# Patient Record
Sex: Female | Born: 1955
Health system: Southern US, Community
[De-identification: ages and names within clinical notes are randomized; demographics above are authoritative.]

## PROBLEM LIST (undated history)

## (undated) DIAGNOSIS — R112 Nausea with vomiting, unspecified: Secondary | ICD-10-CM

## (undated) DIAGNOSIS — E079 Disorder of thyroid, unspecified: Secondary | ICD-10-CM

## (undated) DIAGNOSIS — I10 Essential (primary) hypertension: Secondary | ICD-10-CM

## (undated) DIAGNOSIS — J069 Acute upper respiratory infection, unspecified: Secondary | ICD-10-CM

## (undated) DIAGNOSIS — L02214 Cutaneous abscess of groin: Secondary | ICD-10-CM

## (undated) DIAGNOSIS — L732 Hidradenitis suppurativa: Secondary | ICD-10-CM

## (undated) DIAGNOSIS — G8929 Other chronic pain: Secondary | ICD-10-CM

## (undated) DIAGNOSIS — D649 Anemia, unspecified: Secondary | ICD-10-CM

## (undated) DIAGNOSIS — T783XXA Angioneurotic edema, initial encounter: Secondary | ICD-10-CM

## (undated) DIAGNOSIS — R51 Headache: Secondary | ICD-10-CM

## (undated) DIAGNOSIS — M1990 Unspecified osteoarthritis, unspecified site: Secondary | ICD-10-CM

## (undated) DIAGNOSIS — E039 Hypothyroidism, unspecified: Secondary | ICD-10-CM

## (undated) DIAGNOSIS — R519 Headache, unspecified: Secondary | ICD-10-CM

## (undated) DIAGNOSIS — F419 Anxiety disorder, unspecified: Secondary | ICD-10-CM

## (undated) DIAGNOSIS — K219 Gastro-esophageal reflux disease without esophagitis: Secondary | ICD-10-CM

## (undated) DIAGNOSIS — T7840XA Allergy, unspecified, initial encounter: Secondary | ICD-10-CM

## (undated) DIAGNOSIS — Z9889 Other specified postprocedural states: Secondary | ICD-10-CM

## (undated) DIAGNOSIS — I499 Cardiac arrhythmia, unspecified: Secondary | ICD-10-CM

## (undated) DIAGNOSIS — G479 Sleep disorder, unspecified: Secondary | ICD-10-CM

## (undated) HISTORY — DX: Headache: R51

## (undated) HISTORY — PX: INCISE AND DRAIN ABCESS: PRO64

## (undated) HISTORY — DX: Other chronic pain: G89.29

## (undated) HISTORY — DX: Acute upper respiratory infection, unspecified: J06.9

## (undated) HISTORY — DX: Disorder of thyroid, unspecified: E07.9

## (undated) HISTORY — DX: Angioneurotic edema, initial encounter: T78.3XXA

## (undated) HISTORY — DX: Headache, unspecified: R51.9

## (undated) HISTORY — DX: Sleep disorder, unspecified: G47.9

## (undated) HISTORY — DX: Unspecified osteoarthritis, unspecified site: M19.90

## (undated) HISTORY — DX: Cutaneous abscess of groin: L02.214

## (undated) HISTORY — DX: Allergy, unspecified, initial encounter: T78.40XA

## (undated) HISTORY — DX: Hidradenitis suppurativa: L73.2

## (undated) HISTORY — DX: Essential (primary) hypertension: I10

## (undated) HISTORY — PX: BREAST SURGERY: SHX581

## (undated) HISTORY — PX: TUBAL LIGATION: SHX77

## (undated) HISTORY — PX: SINOSCOPY: SHX187

---

## 1991-07-20 HISTORY — PX: ABDOMINAL HYSTERECTOMY: SHX81

## 1999-04-06 ENCOUNTER — Ambulatory Visit (HOSPITAL_BASED_OUTPATIENT_CLINIC_OR_DEPARTMENT_OTHER): Admission: RE | Admit: 1999-04-06 | Discharge: 1999-04-06 | Payer: Self-pay | Admitting: Oral Surgery

## 2000-01-25 ENCOUNTER — Encounter: Payer: Self-pay | Admitting: Internal Medicine

## 2000-01-25 ENCOUNTER — Encounter: Admission: RE | Admit: 2000-01-25 | Discharge: 2000-01-25 | Payer: Self-pay | Admitting: Internal Medicine

## 2000-07-19 HISTORY — PX: TEMPOROMANDIBULAR JOINT SURGERY: SHX35

## 2001-08-24 ENCOUNTER — Emergency Department (HOSPITAL_COMMUNITY): Admission: EM | Admit: 2001-08-24 | Discharge: 2001-08-24 | Payer: Self-pay

## 2001-08-31 ENCOUNTER — Encounter: Admission: RE | Admit: 2001-08-31 | Discharge: 2001-08-31 | Payer: Self-pay | Admitting: Internal Medicine

## 2001-08-31 ENCOUNTER — Encounter: Payer: Self-pay | Admitting: Internal Medicine

## 2002-01-08 ENCOUNTER — Encounter: Payer: Self-pay | Admitting: Internal Medicine

## 2002-01-08 ENCOUNTER — Encounter: Admission: RE | Admit: 2002-01-08 | Discharge: 2002-01-08 | Payer: Self-pay | Admitting: Internal Medicine

## 2002-07-04 ENCOUNTER — Emergency Department (HOSPITAL_COMMUNITY): Admission: EM | Admit: 2002-07-04 | Discharge: 2002-07-04 | Payer: Self-pay | Admitting: Emergency Medicine

## 2004-07-21 ENCOUNTER — Encounter: Admission: RE | Admit: 2004-07-21 | Discharge: 2004-07-21 | Payer: Self-pay | Admitting: Internal Medicine

## 2006-08-23 ENCOUNTER — Encounter: Admission: RE | Admit: 2006-08-23 | Discharge: 2006-08-23 | Payer: Self-pay | Admitting: Internal Medicine

## 2006-11-11 ENCOUNTER — Encounter: Admission: RE | Admit: 2006-11-11 | Discharge: 2006-11-11 | Payer: Self-pay | Admitting: Internal Medicine

## 2006-11-18 ENCOUNTER — Encounter: Admission: RE | Admit: 2006-11-18 | Discharge: 2006-11-18 | Payer: Self-pay | Admitting: Internal Medicine

## 2006-11-24 ENCOUNTER — Encounter: Admission: RE | Admit: 2006-11-24 | Discharge: 2006-11-24 | Payer: Self-pay | Admitting: Internal Medicine

## 2006-11-24 ENCOUNTER — Other Ambulatory Visit: Admission: RE | Admit: 2006-11-24 | Discharge: 2006-11-24 | Payer: Self-pay | Admitting: Interventional Radiology

## 2006-11-24 ENCOUNTER — Encounter (INDEPENDENT_AMBULATORY_CARE_PROVIDER_SITE_OTHER): Payer: Self-pay | Admitting: Specialist

## 2006-12-06 ENCOUNTER — Ambulatory Visit (HOSPITAL_COMMUNITY): Admission: RE | Admit: 2006-12-06 | Discharge: 2006-12-06 | Payer: Self-pay | Admitting: Internal Medicine

## 2006-12-06 ENCOUNTER — Encounter (INDEPENDENT_AMBULATORY_CARE_PROVIDER_SITE_OTHER): Payer: Self-pay | Admitting: Interventional Radiology

## 2007-02-06 ENCOUNTER — Emergency Department (HOSPITAL_COMMUNITY): Admission: EM | Admit: 2007-02-06 | Discharge: 2007-02-06 | Payer: Self-pay | Admitting: Emergency Medicine

## 2007-08-23 ENCOUNTER — Encounter: Admission: RE | Admit: 2007-08-23 | Discharge: 2007-08-23 | Payer: Self-pay | Admitting: Internal Medicine

## 2009-02-10 ENCOUNTER — Encounter: Admission: RE | Admit: 2009-02-10 | Discharge: 2009-02-10 | Payer: Self-pay | Admitting: Obstetrics & Gynecology

## 2010-02-10 ENCOUNTER — Encounter: Admission: RE | Admit: 2010-02-10 | Discharge: 2010-02-10 | Payer: Self-pay | Admitting: Internal Medicine

## 2010-07-19 HISTORY — PX: INGUINAL HIDRADENITIS EXCISION: SHX1827

## 2010-08-09 ENCOUNTER — Encounter: Payer: Self-pay | Admitting: Internal Medicine

## 2010-12-30 ENCOUNTER — Other Ambulatory Visit: Payer: Self-pay | Admitting: Internal Medicine

## 2010-12-30 DIAGNOSIS — E049 Nontoxic goiter, unspecified: Secondary | ICD-10-CM

## 2010-12-31 ENCOUNTER — Ambulatory Visit
Admission: RE | Admit: 2010-12-31 | Discharge: 2010-12-31 | Disposition: A | Discharge status: Home / Self Care | Payer: 59 | Source: Ambulatory Visit | Attending: Internal Medicine | Admitting: Internal Medicine

## 2010-12-31 DIAGNOSIS — E049 Nontoxic goiter, unspecified: Secondary | ICD-10-CM

## 2011-01-06 ENCOUNTER — Encounter (INDEPENDENT_AMBULATORY_CARE_PROVIDER_SITE_OTHER): Payer: Self-pay | Admitting: Surgery

## 2011-03-03 ENCOUNTER — Ambulatory Visit (INDEPENDENT_AMBULATORY_CARE_PROVIDER_SITE_OTHER): Payer: 59 | Admitting: Surgery

## 2011-03-03 ENCOUNTER — Encounter (INDEPENDENT_AMBULATORY_CARE_PROVIDER_SITE_OTHER): Payer: Self-pay | Admitting: Surgery

## 2011-03-03 VITALS — BP 128/92 | HR 60 | Temp 97.4°F | Ht 65.5 in | Wt 192.2 lb

## 2011-03-03 DIAGNOSIS — L732 Hidradenitis suppurativa: Secondary | ICD-10-CM

## 2011-03-03 MED ORDER — DOXYCYCLINE HYCLATE 100 MG PO TABS: 100.0000 mg | ORAL_TABLET | Freq: Two times a day (BID) | ORAL | Status: AC

## 2011-03-03 NOTE — Progress Notes (Signed)
Subjective:     Patient ID: Regina Daniels, female   DOB: 20-Oct-1955, 55 y.o.   MRN: [id number]  HPI The patient presents today with a small abscess in her left groin. She was seen here in May and had an I&D of a left groin abscess by Dr. Corliss Skains. This area has not healed and has continued to drain since that time. The area does swell all become reddened and drained. It feels better afterwards but now it's been draining continuously for the last 3 weeks. The drainage appears to be yellow in color. She has a small area below this near her left labia that is swollen up over the last week or so. It is tender. It is red. He is not draining currently.    Review of Systems  Constitutional: Negative.   HENT: Negative.   Respiratory: Negative.   Gastrointestinal: Negative.   Psychiatric/Behavioral: Negative.        Objective:   Physical Exam  Constitutional: She appears well-developed and well-nourished.  HENT:  Head: Normocephalic and atraumatic.  Nose: Nose normal.  Skin:       Left groin shows small sinus tract draining yellow fluid. No fluctuance. Along the left inferior labia is a 1 cm red, fluctuant and tender region.  Psychiatric: She has a normal mood and affect. Her behavior is normal.       Assessment:     Left labial abscess  Questionable hidradenitis left groin with sinus tract    Plan:     The draining sinus tract is well drained. There is no fluctuance. This may be a small area of hidradenitis. She does have a small left labial abscess. I recommend drainage in the office today. Risks include bleeding, infection, and recurrence. She understands and wishes to  proceed. Using Betadine 1% and lidocaine the region along the left labial was injected. Scalpel was used to open the small fluctuant area with minimal drainage. This was superficial. Dry dressing was applied. She tolerated the procedure well. She will return to see Dr. Corliss Skains since he saw her initially for the draining wound  that has not resolved in 2 weeks.

## 2011-03-03 NOTE — Patient Instructions (Signed)
Keep the area clean with soap and water.  Return to clinic in two weeks.  Take all of the antibiotics.

## 2011-03-04 ENCOUNTER — Encounter (INDEPENDENT_AMBULATORY_CARE_PROVIDER_SITE_OTHER): Payer: Self-pay | Admitting: Surgery

## 2011-03-25 ENCOUNTER — Ambulatory Visit (INDEPENDENT_AMBULATORY_CARE_PROVIDER_SITE_OTHER): Payer: 59 | Admitting: Surgery

## 2011-03-25 ENCOUNTER — Encounter (INDEPENDENT_AMBULATORY_CARE_PROVIDER_SITE_OTHER): Payer: Self-pay | Admitting: Surgery

## 2011-03-25 VITALS — BP 146/90 | HR 60

## 2011-03-25 DIAGNOSIS — L732 Hidradenitis suppurativa: Secondary | ICD-10-CM | POA: Insufficient documentation

## 2011-03-25 MED ORDER — DOXYCYCLINE HYCLATE 100 MG PO CAPS: 100.0000 mg | ORAL_CAPSULE | Freq: Two times a day (BID) | ORAL | Status: AC

## 2011-03-25 NOTE — Patient Instructions (Signed)
We will schedule your surgery today. 

## 2011-03-25 NOTE — Progress Notes (Signed)
This patient has had recurrent left groin hidradenitis. This was drained again by Dr. Luisa Hart 2 weeks ago. This area continues to drain a small amount of purulent fluid. She had another small area nearby open up and drain some purulent fluid. All the seems be within a couple centimeters of each other.  On examination there is minimal induration in her left groin. There were 2 adjacent opening setter each draining small amounts of purulent fluid. Minimal scar tissue underneath this area. This likely represents a localized area of hidradenitis.  Impression: Left groin hidradenitis  Plan: Recommend excision of this area under anesthesia. The seems to be a very limited area of infection so should be pretty straightforward to remove all of the involved tissue. Prior to surgery we will keep her on doxycycline.The surgical procedure has been discussed with the patient.  Potential risks, benefits, alternative treatments, and expected outcomes have been explained.  All of the patient's questions at this time have been answered.  The patient understand the proposed surgical procedure and wishes to proceed.

## 2011-03-31 ENCOUNTER — Other Ambulatory Visit (INDEPENDENT_AMBULATORY_CARE_PROVIDER_SITE_OTHER): Payer: Self-pay | Admitting: Surgery

## 2011-03-31 ENCOUNTER — Encounter (HOSPITAL_COMMUNITY): Payer: 59

## 2011-03-31 ENCOUNTER — Ambulatory Visit (HOSPITAL_COMMUNITY)
Admission: RE | Admit: 2011-03-31 | Discharge: 2011-03-31 | Disposition: A | Discharge status: Home / Self Care | Payer: 59 | Source: Ambulatory Visit | Attending: Surgery | Admitting: Surgery

## 2011-03-31 DIAGNOSIS — Z01818 Encounter for other preprocedural examination: Secondary | ICD-10-CM | POA: Insufficient documentation

## 2011-03-31 DIAGNOSIS — Z01812 Encounter for preprocedural laboratory examination: Secondary | ICD-10-CM | POA: Insufficient documentation

## 2011-03-31 DIAGNOSIS — Z01811 Encounter for preprocedural respiratory examination: Secondary | ICD-10-CM

## 2011-03-31 DIAGNOSIS — L732 Hidradenitis suppurativa: Secondary | ICD-10-CM | POA: Insufficient documentation

## 2011-03-31 DIAGNOSIS — I10 Essential (primary) hypertension: Secondary | ICD-10-CM | POA: Insufficient documentation

## 2011-03-31 LAB — CBC
HCT: 41 % (ref 36.0–46.0)
Hemoglobin: 12.8 g/dL (ref 12.0–15.0)
MCH: 29.5 pg (ref 26.0–34.0)
MCHC: 31.2 g/dL (ref 30.0–36.0)
MCV: 94.5 fL (ref 78.0–100.0)
Platelets: 311 10*3/uL (ref 150–400)

## 2011-03-31 LAB — BASIC METABOLIC PANEL
CO2: 31 mEq/L (ref 19–32)
Chloride: 97 mEq/L (ref 96–112)

## 2011-03-31 NOTE — Progress Notes (Signed)
Quick Note:  This patient may proceed with surgery ______ 

## 2011-04-05 ENCOUNTER — Other Ambulatory Visit (INDEPENDENT_AMBULATORY_CARE_PROVIDER_SITE_OTHER): Payer: Self-pay | Admitting: Surgery

## 2011-04-05 ENCOUNTER — Ambulatory Visit (HOSPITAL_COMMUNITY)
Admission: RE | Admit: 2011-04-05 | Discharge: 2011-04-05 | Disposition: A | Discharge status: Home / Self Care | Payer: 59 | Source: Ambulatory Visit | Attending: Surgery | Admitting: Surgery

## 2011-04-05 DIAGNOSIS — L732 Hidradenitis suppurativa: Secondary | ICD-10-CM | POA: Insufficient documentation

## 2011-04-05 DIAGNOSIS — Z01812 Encounter for preprocedural laboratory examination: Secondary | ICD-10-CM | POA: Insufficient documentation

## 2011-04-05 DIAGNOSIS — Z01818 Encounter for other preprocedural examination: Secondary | ICD-10-CM | POA: Insufficient documentation

## 2011-04-05 DIAGNOSIS — I10 Essential (primary) hypertension: Secondary | ICD-10-CM | POA: Insufficient documentation

## 2011-04-05 LAB — POCT I-STAT 4, (NA,K, GLUC, HGB,HCT)
HCT: 42 % (ref 36.0–46.0)
Hemoglobin: 14.3 g/dL (ref 12.0–15.0)
Potassium: 3.5 mEq/L (ref 3.5–5.1)

## 2011-04-06 NOTE — Op Note (Signed)
  NAMEYEMAYA, BARNIER                 ACCOUNT NO.:  [id number]  MEDICAL RECORD NO.:  [id number]  LOCATION:  DAYL                         FACILITY:  [facility]  PHYSICIAN:  Wilmon Arms. Corliss Skains, M.D. DATE OF BIRTH:  1955/11/20  DATE OF PROCEDURE:  04/05/2011 DATE OF DISCHARGE:                              OPERATIVE REPORT   PREOPERATIVE DIAGNOSIS:  Bilateral inguinal hidradenitis.  POSTOPERATIVE DIAGNOSIS:  Bilateral inguinal hidradenitis.  PROCEDURE:  Wide excision of bilateral inguinal hidradenitis.  SURGEON:  Wilmon Arms. Nassir Neidert, M.D.  ANESTHESIA:  General.  INDICATIONS:  This is a 55 year old female, who has had recurrent problems with left inguinal hidradenitis.  This has been drained several times and continues to drain some purulent fluid.  Recently, she began having some problems in the right groin and has developed a small firm area.  No drainage from this area.  DESCRIPTION OF PROCEDURE:  The patient is brought to the operating room and placed in the supine position on operating room table.  After adequate level of general anesthesia was obtained, both legs were placed in Yellofin stirrups in lithotomy position.  We prepped the perineum with Betadine and draped in sterile fashion.  Time-out was taken to ensure the proper patient and proper procedure.  We began in the patient's right groin.  We could palpate a 1.5 cm subcutaneous mass.  We made elliptical incision around this and removed the entire mass back to normal appearing tissue.  We irrigated it thoroughly and cauterized for hemostasis.  We then turned attention to the left side.  This opened draining sinus.  We probed this with a small hemostat and seemed to track about a centimeter.  We excised all of this old granulation tissue and the fistulous tract, back to healthy-appearing adipose tissue.  We irrigated thoroughly and cauterized for hemostasis.  Due to the size of these incisions and this location in the perineum  that  would be difficult to keep dressings in these.  We irrigated again with warm sterile saline.  Closed the subcutaneous tissues with 3-0 Vicryl and the skin with 4-0 Monocryl in subcuticular fashion.  We did this with both incisions.  We sealed both incisions with Dermabond and allowed this to dry.  The patient was then extubated and brought to recovery in stable condition.  All sponge, instrument, needle counts were correct.     Wilmon Arms. Corliss Skains, M.D.     MKT/MEDQ  D:  04/05/2011  T:  04/05/2011  Job:  [id number]  Electronically Signed by Manus Rudd M.D. on 04/06/2011 11:10:19 PM

## 2011-04-07 ENCOUNTER — Encounter (INDEPENDENT_AMBULATORY_CARE_PROVIDER_SITE_OTHER): Payer: Self-pay | Admitting: Surgery

## 2011-04-13 ENCOUNTER — Ambulatory Visit (INDEPENDENT_AMBULATORY_CARE_PROVIDER_SITE_OTHER): Payer: 59 | Admitting: Surgery

## 2011-04-13 VITALS — BP 128/78 | HR 68 | Temp 97.2°F | Resp 16 | Ht 65.5 in | Wt 190.2 lb

## 2011-04-13 DIAGNOSIS — L732 Hidradenitis suppurativa: Secondary | ICD-10-CM

## 2011-04-13 NOTE — Progress Notes (Signed)
Subjective:     Patient ID: Regina Daniels, female   DOB: [birth year], 55 y.o.   MRN: [id number]  HPI  Diagnosis: Persistent hidradenitis bilateral inner thighs  Procedure: Removal of above 04/05/2011 by Dr. Corliss Skains.  Patient comes today feeling okay. She was concerned about having low-grade fevers in the 99-100 range. Otherwise feels well. Minimal drainage from the incisions at first, but now everything is dry. No pain. No redness. No swelling.  Patient Care Team: Lorenda Peck as PCP - General (Internal Medicine)  This patient is a 55 y.o.female who presents today for surgical evaluation.     Past Medical History  Diagnosis Date  . Hypertension   . Arthritis   . Chronic headaches     Sinus  . Groin abscess   . Allergy   . Sleep difficulties   . Thyroid disease     hypothyroidism    Past Surgical History  Procedure Date  . Abdominal hysterectomy 1993  . Breast surgery 1993 and 1994    Lt lumpectomy  . Temporomandibular joint surgery 2002  . Incise and drain abcess     abscess left groin  . Tubal ligation   . Inguinal hidradenitis excision 2012    bil    History   Social History  . Marital Status: Widowed    Spouse Name: N/A    Number of Children: N/A  . Years of Education: N/A   Occupational History  . Not on file.   Social History Main Topics  . Smoking status: Never Smoker   . Smokeless tobacco: Not on file  . Alcohol Use: No  . Drug Use: No  . Sexually Active:    Other Topics Concern  . Not on file   Social History Narrative  . No narrative on file    Family History  Problem Relation Age of Onset  . Heart disease Mother   . Diabetes Mother   . Stroke Mother   . Cancer Father   . Heart disease Father   . Diabetes Father   . Stroke Father     Current outpatient prescriptions:aspirin 81 MG tablet, Take 81 mg by mouth daily.  , Disp: , Rfl: ;  ATENOLOL PO, Take 20 mg by mouth daily.  , Disp: , Rfl: ;  cetirizine (ZYRTEC) 10 MG tablet,  Take 10 mg by mouth daily.  , Disp: , Rfl: ;  estrogens, conjugated, (PREMARIN) 0.9 MG tablet, Take 0.9 mg by mouth daily. Take daily for 21 days then do not take for 7 days. , Disp: , Rfl:  fluticasone (FLONASE) 50 MCG/ACT nasal spray, Daily., Disp: , Rfl: ;  levothyroxine (SYNTHROID, LEVOTHROID) 25 MCG tablet, Daily., Disp: , Rfl: ;  triamterene-hydrochlorothiazide (MAXZIDE) 75-50 MG per tablet, Take 1 tablet by mouth daily.  , Disp: , Rfl:   Allergies  Allergen Reactions  . Demerol Hives and Rash    All over body  . Epinephrine Palpitations       Review of Systems  All other systems reviewed and are negative.       Objective:   Physical Exam  Constitutional: She is oriented to person, place, and time. She appears well-developed and well-nourished. No distress.  HENT:  Head: Normocephalic.  Mouth/Throat: Oropharynx is clear and moist. No oropharyngeal exudate.  Eyes: Conjunctivae and EOM are normal. Pupils are equal, round, and reactive to light. No scleral icterus.  Neck: Normal range of motion. Neck supple. No tracheal deviation present.  Cardiovascular: Normal rate  and intact distal pulses.   Pulmonary/Chest: Effort normal and breath sounds normal. No respiratory distress. She exhibits no tenderness.  Abdominal: Soft. She exhibits no distension and no mass. There is no tenderness. Hernia confirmed negative in the right inguinal area and confirmed negative in the left inguinal area.        No hernias  Genitourinary: No vaginal discharge found.       Incisions inner thighs clean with normal healing ridges. No cellulitis/pain  Musculoskeletal: Normal range of motion. She exhibits no tenderness.  Lymphadenopathy:    She has no cervical adenopathy.       Right: No inguinal adenopathy present.       Left: No inguinal adenopathy present.  Neurological: She is alert and oriented to person, place, and time. No cranial nerve deficit. She exhibits normal muscle tone. Coordination  normal.  Skin: Skin is warm and dry. No rash noted. She is not diaphoretic. No erythema.  Psychiatric: She has a normal mood and affect. Her behavior is normal. Judgment and thought content normal.       Assessment:     POD #7 s/p removal hidradenitis B inner thighs.  No evidence of infection    Plan:     Keep the incisions clean and dry.  Minimize irritation with Band-Aids. Overweight underwear rubbing at the incisions.  Call if fevers above 101.5, worsening pain, drainage, or other concerns.  Keep appointment to see Dr. Corliss Skains next week.  The patient expressed understanding and appreciation

## 2011-04-13 NOTE — Patient Instructions (Signed)
Keep incisions clean & dry.  Band-Aids okay to protect irritation.  Temperatures <101F are not a major concern as long as the incisions look well & there are no other problems  Call if worse

## 2011-04-20 ENCOUNTER — Encounter (INDEPENDENT_AMBULATORY_CARE_PROVIDER_SITE_OTHER): Payer: Self-pay | Admitting: Surgery

## 2011-04-21 ENCOUNTER — Encounter (INDEPENDENT_AMBULATORY_CARE_PROVIDER_SITE_OTHER): Payer: Self-pay | Admitting: Surgery

## 2011-04-21 ENCOUNTER — Ambulatory Visit (INDEPENDENT_AMBULATORY_CARE_PROVIDER_SITE_OTHER): Payer: 59 | Admitting: Surgery

## 2011-04-21 VITALS — BP 119/77 | HR 62 | Temp 97.9°F | Resp 12 | Ht 66.0 in | Wt 191.8 lb

## 2011-04-21 DIAGNOSIS — L732 Hidradenitis suppurativa: Secondary | ICD-10-CM

## 2011-04-21 NOTE — Patient Instructions (Signed)
Neosporin and band-aid to right groin until completely healed.  Follow-up as needed.

## 2011-04-21 NOTE — Progress Notes (Signed)
S/p excision of bilateral groin hidradenitis 04/05/11.  The incisions seem to be healing well.  On the right, she has a small superficial skin separation, but no drainage, no erythema.  She may cover this with Neosporin/ band-aid until healed completely.  Follow-up PRN.

## 2012-01-27 ENCOUNTER — Encounter (INDEPENDENT_AMBULATORY_CARE_PROVIDER_SITE_OTHER): Payer: Self-pay | Admitting: General Surgery

## 2012-01-27 ENCOUNTER — Ambulatory Visit (INDEPENDENT_AMBULATORY_CARE_PROVIDER_SITE_OTHER): Payer: 59 | Admitting: General Surgery

## 2012-01-27 VITALS — BP 132/82 | HR 70 | Temp 98.2°F | Ht 66.0 in | Wt 200.0 lb

## 2012-01-27 DIAGNOSIS — L732 Hidradenitis suppurativa: Secondary | ICD-10-CM

## 2012-01-27 MED ORDER — OXYCODONE-ACETAMINOPHEN 5-325 MG PO TABS: 1.0000 | ORAL_TABLET | Freq: Four times a day (QID) | ORAL | Status: DC | PRN

## 2012-01-27 NOTE — Patient Instructions (Signed)
Abscess Care After An abscess (also called a boil or furuncle) is an infected area that contains a collection of pus. Signs and symptoms of an abscess include pain, tenderness, redness, or hardness, or you may feel a moveable soft area under your skin. An abscess can occur anywhere in the body. The infection may spread to surrounding tissues causing cellulitis. A cut (incision) by the surgeon was made over your abscess and the pus was drained out. Gauze may have been packed into the space to provide a drain that will allow the cavity to heal from the inside outwards. The boil may be painful for 5 to 7 days. Most people with a boil do not have high fevers. Your abscess, if seen early, may not have localized, and may not have been lanced. If not, another appointment may be required for this if it does not get better on its own or with medications.  HOME CARE INSTRUCTIONS   Only take over-the-counter or prescription medicines for pain, discomfort, or fever as directed by your caregiver.   When you bathe, soak and then remove gauze or iodoform packs. You may then wash the wound gently with mild soapy water. Cover with gauze or do as your caregiver directs.   SEEK IMMEDIATE MEDICAL CARE IF:   You develop increased pain, swelling, redness, drainage, or bleeding in the wound site.   You develop signs of generalized infection including muscle aches, chills, fever, or a general ill feeling.   An oral temperature above 102 F (38.9 C) develops, not controlled by medication.  See your caregiver for a recheck if you develop any of the symptoms described above. If medications (antibiotics) were prescribed, take them as directed.   

## 2012-01-27 NOTE — Progress Notes (Signed)
Subjective:     Patient ID: Regina Daniels, female   DOB: April 27, 1956, 56 y.o.   MRN: [id number]  HPI 56 year old female comes in because of concerns of recurrent left groin hidradenitis. She underwent excision of bilateral inguinal hydradenitis in September by Dr. Corliss Skains. She states that she started having some soreness in June but it eventually went away. Last week the area returned in her left medial inguinal crease. The area has got larger and more tender. She states that it's throbbing. She denies any drainage from the area. She states it has been red. She denies any fevers or chills.  Review of Systems     Objective:   Physical Exam BP 132/82  Pulse 70  Temp 98.2 F (36.8 C) (Temporal)  Ht 5\' 6"  (1.676 m)  Wt 200 lb (90.719 kg)  BMI 32.28 kg/m2  SpO2 97% Nontoxic, nad Left inner groin crease - old scar. Raised fluctuant area in scar with some celluitis    Assessment:     Left groin abscess    Plan:     I recommended incision and drainage since the area was fluctuant and tender. After obtaining verbal consent, the area was prepped with Betadine. 2% Xylocaine with epinephrine and sodium bicarbonate was infiltrated into the skin and dermis. A one-inch incision was then made. seropurulent fluid drained out. The wound was packed with iodoform gauze followed by dry gauze. She tolerated the procedure well. She was given a prescription for antibiotics as well as pain medicine. She is given wound care instructions. Followup with Dr. Corliss Skains.  Mary Sella. Andrey Campanile, MD, FACS General, Bariatric, & Minimally Invasive Surgery [facility] Surgery, [place]

## 2012-02-02 ENCOUNTER — Other Ambulatory Visit: Payer: Self-pay | Admitting: Internal Medicine

## 2012-02-02 DIAGNOSIS — R109 Unspecified abdominal pain: Secondary | ICD-10-CM

## 2012-02-03 ENCOUNTER — Ambulatory Visit
Admission: RE | Admit: 2012-02-03 | Discharge: 2012-02-03 | Disposition: A | Discharge status: Home / Self Care | Payer: 59 | Source: Ambulatory Visit | Attending: Internal Medicine | Admitting: Internal Medicine

## 2012-02-03 DIAGNOSIS — R109 Unspecified abdominal pain: Secondary | ICD-10-CM

## 2012-02-03 MED ORDER — IOHEXOL 300 MG/ML  SOLN
100.0000 mL | Freq: Once | INTRAMUSCULAR | Status: AC | PRN
  Administered 2012-02-03: 100 mL via INTRAVENOUS

## 2012-03-09 ENCOUNTER — Other Ambulatory Visit: Payer: Self-pay | Admitting: Obstetrics & Gynecology

## 2012-03-09 DIAGNOSIS — R928 Other abnormal and inconclusive findings on diagnostic imaging of breast: Secondary | ICD-10-CM

## 2012-03-14 ENCOUNTER — Ambulatory Visit
Admission: RE | Admit: 2012-03-14 | Discharge: 2012-03-14 | Disposition: A | Discharge status: Home / Self Care | Payer: 59 | Source: Ambulatory Visit | Attending: Obstetrics & Gynecology | Admitting: Obstetrics & Gynecology

## 2012-03-14 DIAGNOSIS — R928 Other abnormal and inconclusive findings on diagnostic imaging of breast: Secondary | ICD-10-CM

## 2012-06-06 ENCOUNTER — Ambulatory Visit (INDEPENDENT_AMBULATORY_CARE_PROVIDER_SITE_OTHER): Payer: 59 | Admitting: General Surgery

## 2012-06-06 ENCOUNTER — Encounter (INDEPENDENT_AMBULATORY_CARE_PROVIDER_SITE_OTHER): Payer: Self-pay | Admitting: General Surgery

## 2012-06-06 VITALS — BP 126/84 | HR 58 | Temp 97.0°F | Resp 18 | Ht 66.0 in | Wt 198.6 lb

## 2012-06-06 DIAGNOSIS — L732 Hidradenitis suppurativa: Secondary | ICD-10-CM

## 2012-06-06 MED ORDER — SULFAMETHOXAZOLE-TRIMETHOPRIM 800-160 MG PO TABS: 1.0000 | ORAL_TABLET | Freq: Two times a day (BID) | ORAL | Status: AC

## 2012-06-06 NOTE — Progress Notes (Signed)
Subjective:     Patient ID: MECHELLE PATES, female   DOB: 07/07/1956, 56 y.o.   MRN: [id number]  HPI The patient 56 year old female with several week history of a left abscess which spontaneously drained, dictates that bloody purulent drainage.  The patient also has a history of having an excision of bilateral inguinal hidradenitis per Dr. Corliss Skains.  Patient had previous I&D of a left ankle abscess. These issues and resume taking antibiotics for a breast or infection.  Review of Systems  Constitutional: Negative.   HENT: Negative.   Eyes: Negative.   Respiratory: Negative.   Cardiovascular: Negative.   Gastrointestinal: Negative.   Musculoskeletal: Negative.   Neurological: Negative.        Objective:   Physical Exam  Constitutional: She is oriented to person, place, and time. She appears well-developed and well-nourished.  HENT:  Head: Normocephalic and atraumatic.  Eyes: Conjunctivae normal and EOM are normal. Pupils are equal, round, and reactive to light.  Neck: Normal range of motion. Neck supple.  Cardiovascular: Normal rate.   Pulmonary/Chest: Effort normal and breath sounds normal.  Abdominal: Soft. Bowel sounds are normal.  Genitourinary:     Neurological: She is alert and oriented to person, place, and time.       Assessment:     This 56 year old female status post hidradenitis excision with resolving left inguinal abscess.    Plan:     1. The patient prescription for Bactrim at this time. Patient continue with routine showers.  2. Patient followed Dr. Corliss Skains x2 weeks.

## 2012-06-12 IMAGING — US US SOFT TISSUE HEAD/NECK
1 series · 14 of 25 positions shown · non-contrast
Comparison: Ultrasound of the thyroid of 11/11/2006

CLINICAL DATA: Enlarged thyroid on physical exam

THYROID ULTRASOUND
TECHNIQUE: Ultrasound examination of the thyroid gland and adjacent
soft tissues was performed.

[Series 1: us soft tissue head/neck · 0.09mm/px · 14 of 75 slices shown]
[im 1/75]
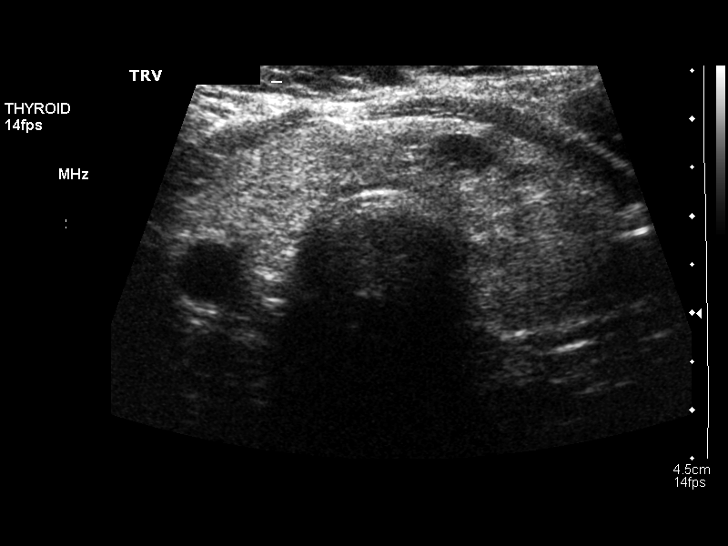
[im 7/75]
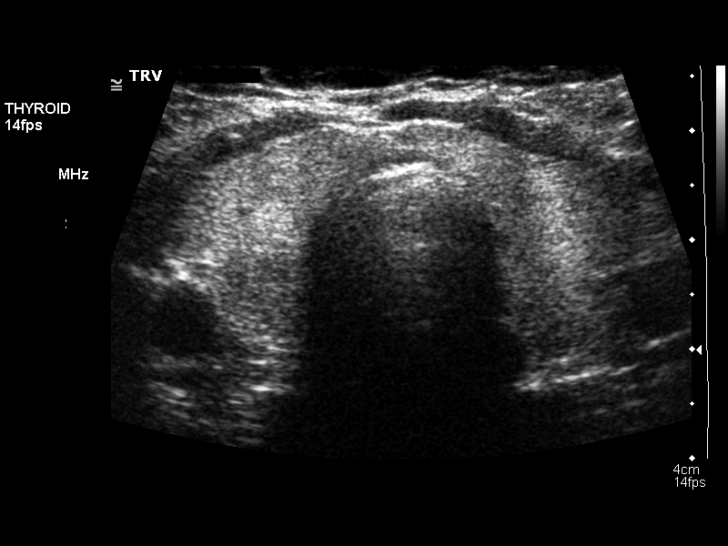
[im 13/75]
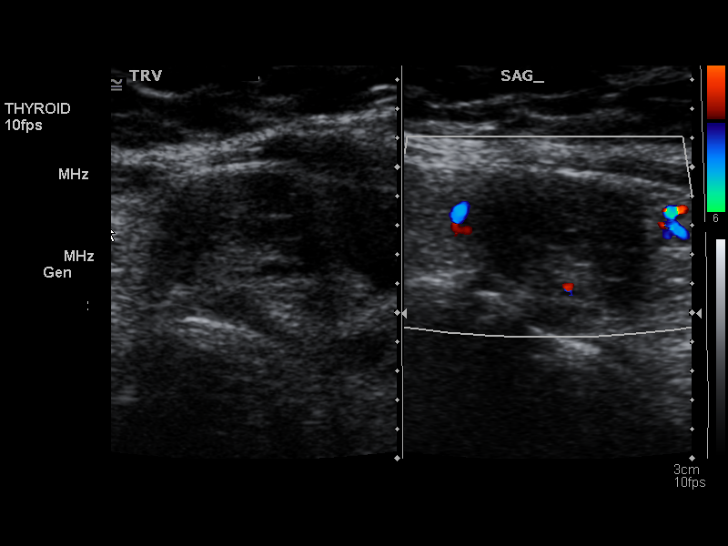
[im 19/75]
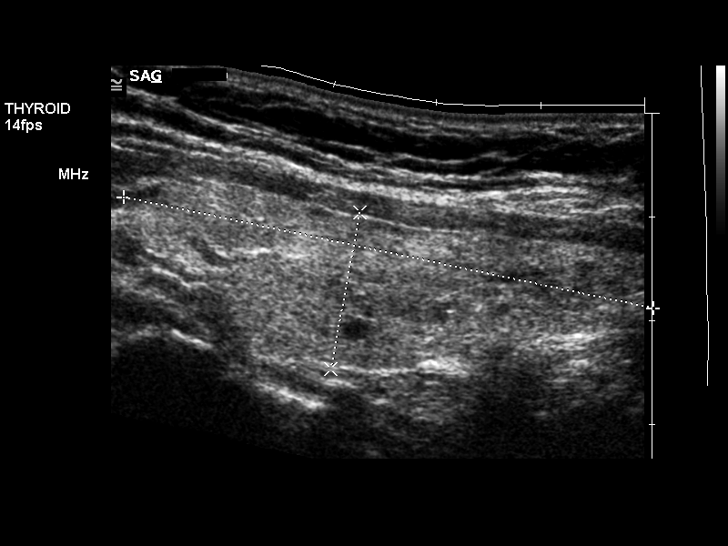
[im 25/75]
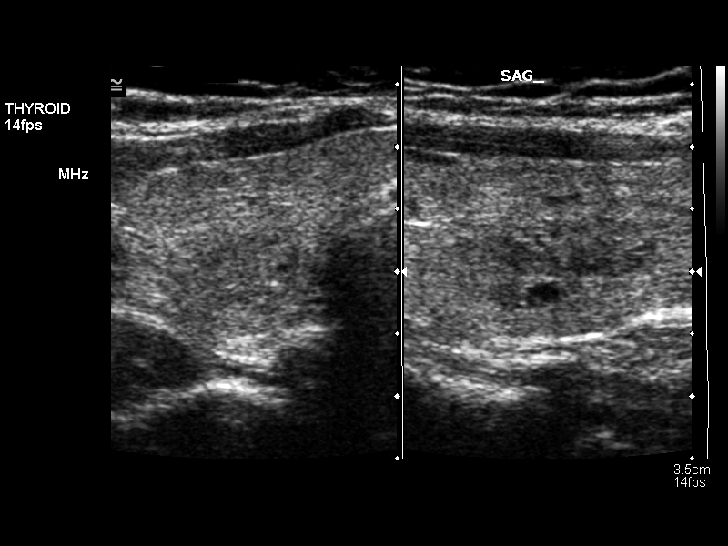
[im 28/75]
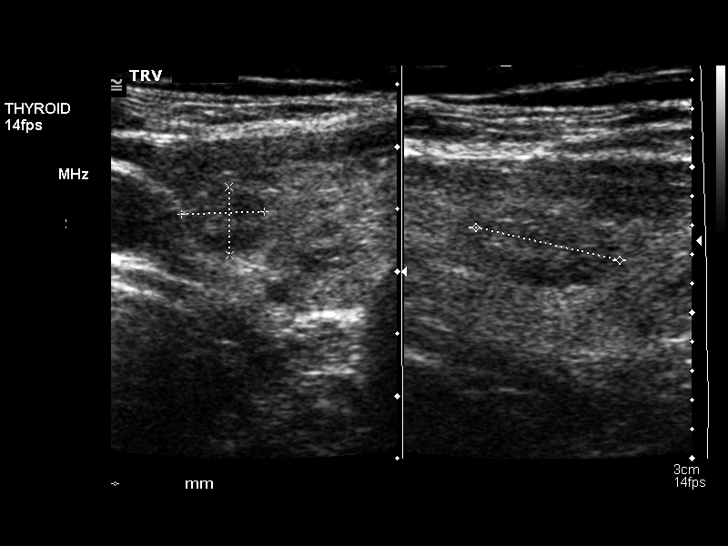
[im 34/75]
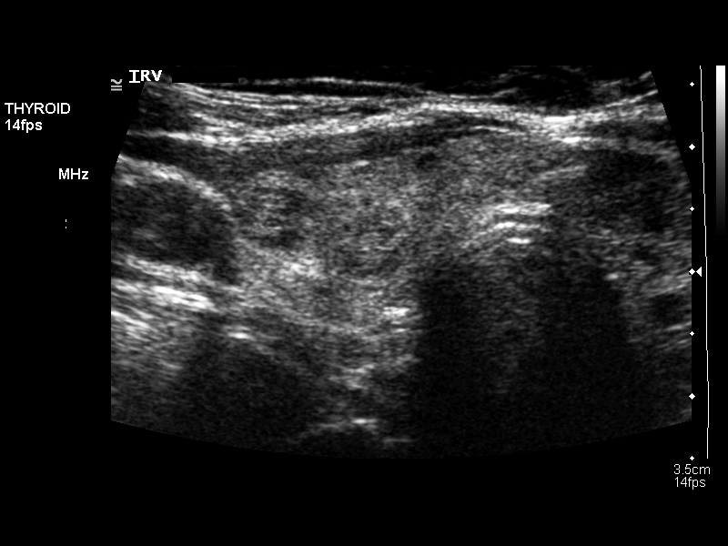
[im 41/75]
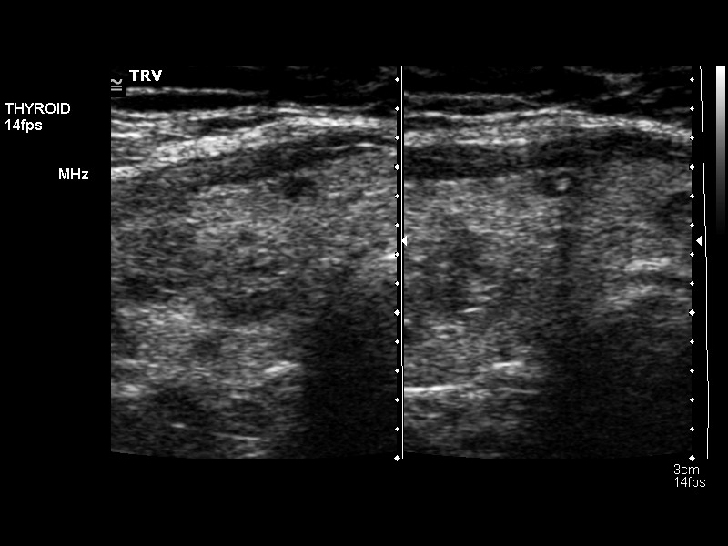
[im 47/75]
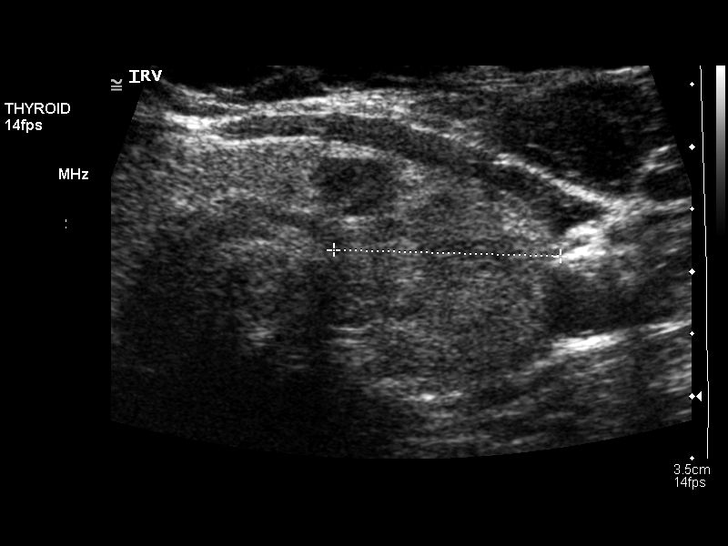
[im 50/75]
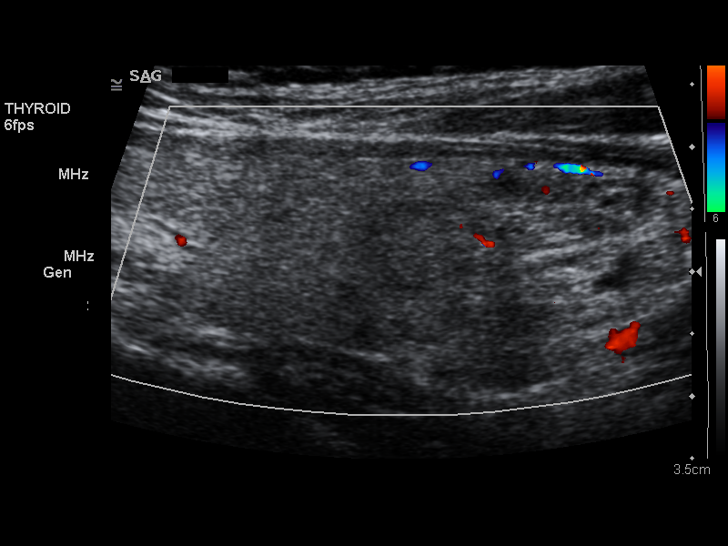
[im 56/75]
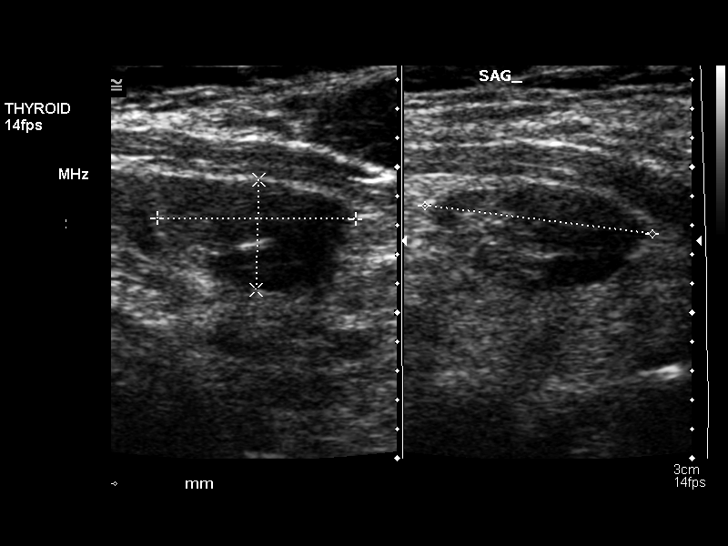
[im 62/75]
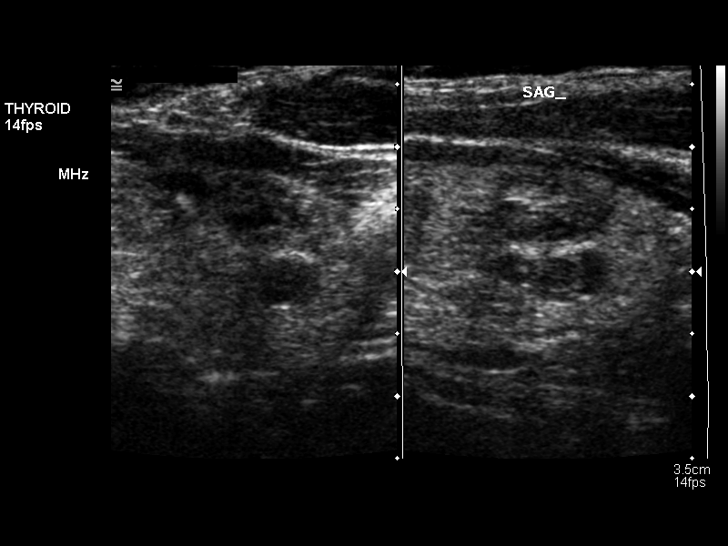
[im 68/75]
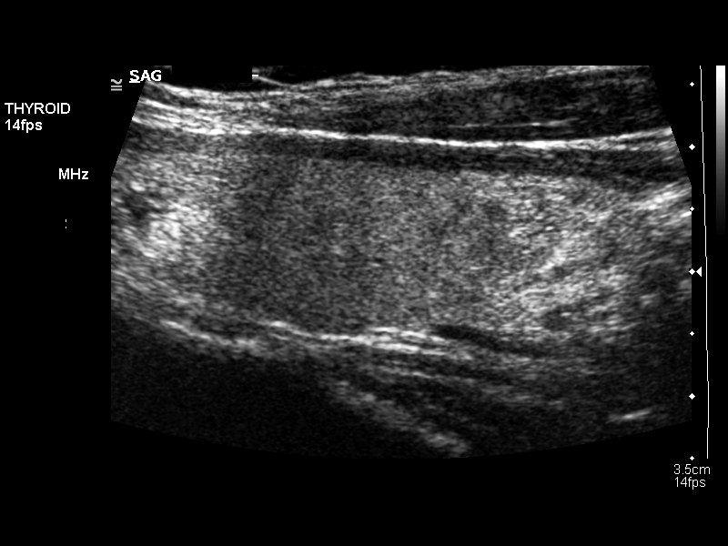
[im 75/75]
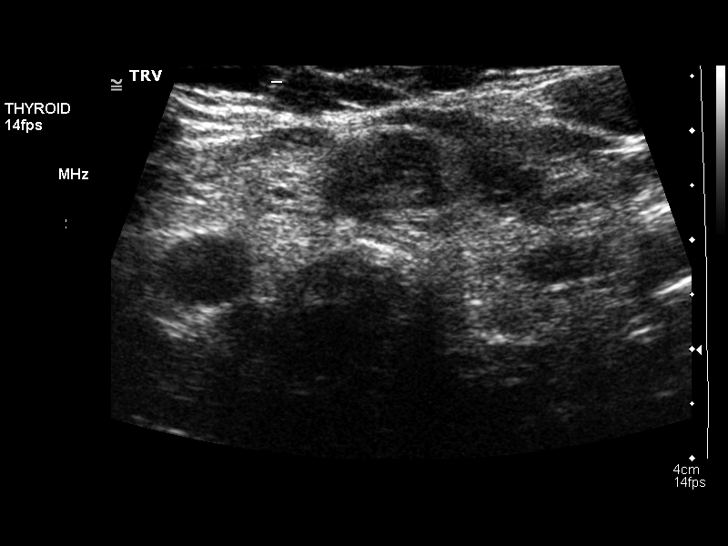

[14 of 25 positions shown; findings below may reference images not displayed]

FINDINGS: Right thyroid lobe:  5.2 x 1.5 x 1.8 cm.  (Previously 5.3 x 2.0 x
1.9 cm.)
Left thyroid lobe:  5.2 x 2.0 x 1.8 cm.  (Previously 5.6 x 1.9 x
1.5 cm.)
Isthmus:  7.6 mm compared to 5.3 mm previously.

Focal nodules:  Multiple thyroid nodules again are noted scattered
throughout both lobes.  A complex nodule in the lower pole on the
left measures 1.4 x 0.8 x 1.6 cm.  A complex nodule in the isthmus
superiorly measures 1.4 x 1.0 x 1.3 cm.  A solid nodule in the
lower pole on the right measures 0.9 x 0.9 x 1.0 cm.  Multiple
other nodules are scattered throughout both lobes of no more than
10 mm in maximum diameter.

Lymphadenopathy:  Absent
IMPRESSION: Multiple thyroid nodules most consistent with mild multinodular
goiter.  No enlarging nodule is seen.

## 2012-06-26 ENCOUNTER — Encounter (INDEPENDENT_AMBULATORY_CARE_PROVIDER_SITE_OTHER): Payer: Self-pay | Admitting: Surgery

## 2012-06-26 ENCOUNTER — Ambulatory Visit (INDEPENDENT_AMBULATORY_CARE_PROVIDER_SITE_OTHER): Payer: 59 | Admitting: Surgery

## 2012-06-26 VITALS — BP 98/62 | HR 71 | Temp 96.7°F | Ht 66.0 in | Wt 201.0 lb

## 2012-06-26 DIAGNOSIS — L732 Hidradenitis suppurativa: Secondary | ICD-10-CM

## 2012-06-26 NOTE — Progress Notes (Signed)
This patient is status post wide excision of left groin hidradenitis on 04/05/11. Earlier this summer she had a small recurrence anterior to her excision site. She had some persistent drainage from this area but was recently placed on oral antibiotics.  This infection has responded nicely to a course of Bactrim. She has no drainage. She really has no visible sign of hidradenitis at this time. She comes in today for routine followup.  On examination her surgical incision is completely healed with no sign of infection. No draining sinuses. About 2 cm anterior to this area there is a small punctate opening but no surrounding induration or drainage noted. This is likely that area that was previously infected.  At this time I would not pursue any further treatment. If these areas begin to enlarge then I asked her to call us immediately. We will try her on a course of antibiotics and may need to perform further excision if this persists. Followup as needed  Wilmon Arms. Corliss Skains, MD, [facility]  06/26/2012 10:01 AM

## 2012-06-28 ENCOUNTER — Telehealth (INDEPENDENT_AMBULATORY_CARE_PROVIDER_SITE_OTHER): Payer: Self-pay | Admitting: General Surgery

## 2012-06-28 NOTE — Telephone Encounter (Signed)
Please order Bactrim DS 1 po BID x 7 days, no refill Please bring her in to see me next week.  Pattricia Boss, just make a slot wherever you can.  I can even be double-booked at the beginning of Urgent Office on Wed

## 2012-06-28 NOTE — Telephone Encounter (Signed)
Pt of Dr. Corliss Skains called to report she has a new, pea-sized knot in her Lt groin.  It has come up over the last two days.  Not sure if Dr. Corliss Skains wants to see her again or treat with antibiotics first.

## 2012-07-06 ENCOUNTER — Encounter (INDEPENDENT_AMBULATORY_CARE_PROVIDER_SITE_OTHER): Payer: Self-pay | Admitting: Surgery

## 2012-07-06 ENCOUNTER — Ambulatory Visit (INDEPENDENT_AMBULATORY_CARE_PROVIDER_SITE_OTHER): Payer: 59 | Admitting: Surgery

## 2012-07-06 VITALS — BP 140/82 | HR 60 | Temp 98.0°F | Resp 18 | Ht 66.0 in | Wt 201.0 lb

## 2012-07-06 DIAGNOSIS — L732 Hidradenitis suppurativa: Secondary | ICD-10-CM

## 2012-07-06 MED ORDER — DOXYCYCLINE HYCLATE 100 MG PO CAPS: 100.0000 mg | ORAL_CAPSULE | Freq: Two times a day (BID) | ORAL | Status: DC

## 2012-07-06 NOTE — Progress Notes (Signed)
The patient had a small flareup in her left groin. This spontaneously drained and is now healed and asymptomatic. Anteriorly she had another small area that became slightly swollen but seems to have responded to doxycycline. She is finishing her course of antibiotics tomorrow. Currently there is nothing that needs to be drained. Since she keeps having these small recurrences I will give her a prescription for doxycycline 500 mg by mouth twice a day. She will keep this at home I will start this if she develops any swelling. She also calls for an urgent appointment. We will reevaluate her in 3 weeks.  Wilmon Arms. Corliss Skains, MD, [facility]  07/06/2012 3:45 PM

## 2012-07-28 ENCOUNTER — Encounter (INDEPENDENT_AMBULATORY_CARE_PROVIDER_SITE_OTHER): Payer: 59 | Admitting: General Surgery

## 2012-07-28 ENCOUNTER — Ambulatory Visit (INDEPENDENT_AMBULATORY_CARE_PROVIDER_SITE_OTHER): Payer: 59 | Admitting: Surgery

## 2012-07-28 ENCOUNTER — Encounter (INDEPENDENT_AMBULATORY_CARE_PROVIDER_SITE_OTHER): Payer: Self-pay | Admitting: Surgery

## 2012-07-28 VITALS — BP 132/79 | HR 80 | Temp 98.6°F | Resp 16 | Ht 66.0 in | Wt 199.0 lb

## 2012-07-28 DIAGNOSIS — L732 Hidradenitis suppurativa: Secondary | ICD-10-CM

## 2012-07-28 NOTE — Progress Notes (Signed)
Currently the patient is having no problems with hidradenitis. The area of the left has healed completely. She had a tiny pimple come up over her mons pubis pop quickly and healed quickly. On examination there is no sign of active hidradenitis. The patient has a prescription for doxycycline and will start this if she has a flareup the last one a couple of days. Followup as needed.  Wilmon Arms. Corliss Skains, MD, [facility]  07/28/2012 9:11 AM

## 2012-12-25 ENCOUNTER — Other Ambulatory Visit (INDEPENDENT_AMBULATORY_CARE_PROVIDER_SITE_OTHER): Payer: Self-pay | Admitting: Surgery

## 2012-12-27 ENCOUNTER — Telehealth (INDEPENDENT_AMBULATORY_CARE_PROVIDER_SITE_OTHER): Payer: Self-pay | Admitting: General Surgery

## 2012-12-27 ENCOUNTER — Encounter (INDEPENDENT_AMBULATORY_CARE_PROVIDER_SITE_OTHER): Payer: Self-pay | Admitting: Surgery

## 2012-12-27 NOTE — Telephone Encounter (Signed)
Faxed back refill on Doxycycline 100 mg to [facility] call back # [phone] faxed back to [phone]

## 2014-10-16 ENCOUNTER — Other Ambulatory Visit: Payer: Self-pay | Admitting: Internal Medicine

## 2014-10-16 DIAGNOSIS — E041 Nontoxic single thyroid nodule: Secondary | ICD-10-CM

## 2014-10-18 ENCOUNTER — Ambulatory Visit
Admission: RE | Admit: 2014-10-18 | Discharge: 2014-10-18 | Disposition: A | Discharge status: Home / Self Care | Payer: 59 | Source: Ambulatory Visit | Attending: Internal Medicine | Admitting: Internal Medicine

## 2014-10-18 DIAGNOSIS — E041 Nontoxic single thyroid nodule: Secondary | ICD-10-CM

## 2015-01-29 ENCOUNTER — Other Ambulatory Visit: Payer: Self-pay | Admitting: Gastroenterology

## 2015-04-02 ENCOUNTER — Encounter (HOSPITAL_BASED_OUTPATIENT_CLINIC_OR_DEPARTMENT_OTHER): Payer: Self-pay | Admitting: *Deleted

## 2015-04-07 ENCOUNTER — Encounter (HOSPITAL_BASED_OUTPATIENT_CLINIC_OR_DEPARTMENT_OTHER)
Admission: RE | Admit: 2015-04-07 | Discharge: 2015-04-07 | Disposition: A | Discharge status: Home / Self Care | Payer: 59 | Source: Ambulatory Visit | Attending: Orthopedic Surgery | Admitting: Orthopedic Surgery

## 2015-04-07 ENCOUNTER — Other Ambulatory Visit: Payer: Self-pay

## 2015-04-07 ENCOUNTER — Ambulatory Visit: Payer: Self-pay | Admitting: Physician Assistant

## 2015-04-07 DIAGNOSIS — M19011 Primary osteoarthritis, right shoulder: Secondary | ICD-10-CM | POA: Diagnosis not present

## 2015-04-07 DIAGNOSIS — M75121 Complete rotator cuff tear or rupture of right shoulder, not specified as traumatic: Secondary | ICD-10-CM | POA: Diagnosis not present

## 2015-04-07 DIAGNOSIS — M7542 Impingement syndrome of left shoulder: Secondary | ICD-10-CM | POA: Diagnosis not present

## 2015-04-07 DIAGNOSIS — I10 Essential (primary) hypertension: Secondary | ICD-10-CM | POA: Diagnosis not present

## 2015-04-07 LAB — BASIC METABOLIC PANEL
Anion gap: 9 (ref 5–15)
BUN: 12 mg/dL (ref 6–20)
CALCIUM: 10 mg/dL (ref 8.9–10.3)
CO2: 31 mmol/L (ref 22–32)
Chloride: 101 mmol/L (ref 101–111)
Creatinine, Ser: 0.9 mg/dL (ref 0.44–1.00)
Glucose, Bld: 106 mg/dL — ABNORMAL HIGH (ref 65–99)
POTASSIUM: 3.4 mmol/L — AB (ref 3.5–5.1)
Sodium: 141 mmol/L (ref 135–145)

## 2015-04-08 ENCOUNTER — Ambulatory Visit: Payer: Self-pay | Admitting: Physician Assistant

## 2015-04-08 NOTE — H&P (Signed)
Regina Daniels is an 59 y.o. female.   Chief Complaint: left shoulder pain HPI: Patient treated for several months in our office by Dr. Lynann Bologna for neck and left shoulder pain failing conservative treatments.  MRI shows Left  shoulder, high grade partial tear.    Past Medical History  Diagnosis Date  . Hypertension   . Arthritis   . Chronic headaches     Sinus  . Groin abscess   . Allergy   . Sleep difficulties   . Thyroid disease     hypothyroidism  . Dysrhythmia     palpitations  . Hypothyroidism   . Anxiety   . GERD (gastroesophageal reflux disease)   . Anemia   . PONV (postoperative nausea and vomiting)     Past Surgical History  Procedure Laterality Date  . Abdominal hysterectomy  1993  . Breast surgery  1993 and 1994    Lt lumpectomy  . Temporomandibular joint surgery  2002  . Incise and drain abcess      abscess left groin  . Tubal ligation    . Inguinal hidradenitis excision  2012    bil    Family History  Problem Relation Age of Onset  . Heart disease Mother   . Diabetes Mother   . Stroke Mother   . Cancer Father   . Heart disease Father   . Diabetes Father   . Stroke Father    Social History:  reports that she has never smoked. She does not have any smokeless tobacco history on file. She reports that she does not drink alcohol or use illicit drugs.  Allergies:  Allergies  Allergen Reactions  . Demerol Hives and Rash    All over body  . Epinephrine Palpitations     (Not in a hospital admission)  Results for orders placed or performed during the hospital encounter of 04/09/15 (from the past 48 hour(s))  Basic metabolic panel     Status: Abnormal   Collection Time: 04/07/15  4:15 PM  Result Value Ref Range   Sodium 141 135 - 145 mmol/L   Potassium 3.4 (L) 3.5 - 5.1 mmol/L   Chloride 101 101 - 111 mmol/L   CO2 31 22 - 32 mmol/L   Glucose, Bld 106 (H) 65 - 99 mg/dL   BUN 12 6 - 20 mg/dL   Creatinine, Ser 0.90 0.44 - 1.00 mg/dL   Calcium 10.0  8.9 - 10.3 mg/dL   GFR calc non Af Amer >60 >60 mL/min   GFR calc Af Amer >60 >60 mL/min    Comment: (NOTE) The eGFR has been calculated using the CKD EPI equation. This calculation has not been validated in all clinical situations. eGFR's persistently <60 mL/min signify possible Chronic Kidney Disease.    Anion gap 9 5 - 15   No results found.  Review of Systems  Genitourinary: Positive for dysuria.  Musculoskeletal: Positive for myalgias, joint pain and neck pain.  Neurological: Positive for headaches.  All other systems reviewed and are negative.   There were no vitals taken for this visit. Physical Exam  Constitutional: She is oriented to person, place, and time. She appears well-developed and well-nourished. No distress.  HENT:  Head: Normocephalic and atraumatic.  Nose: Nose normal.  Eyes: EOM are normal. Pupils are equal, round, and reactive to light.  Neck: Normal range of motion. Neck supple.  Cardiovascular: Normal rate and intact distal pulses.   Respiratory: Effort normal. No respiratory distress.  GI: Soft. She  exhibits no distension. There is no tenderness.  Musculoskeletal:       Left shoulder: She exhibits decreased range of motion, tenderness, pain and decreased strength.  Neurological: She is alert and oriented to person, place, and time. No cranial nerve deficit.  Skin: Skin is warm and dry. No erythema.  Psychiatric: She has a normal mood and affect. Her behavior is normal.     Assessment/Plan Left  shoulder, high grade partial tear not responding to injection.  Certainly with the high grade partial tear not responding to injection, recommend consideration for surgery.  The [facility] joint is normal.  She will be a candidate for an acromioplasty, debridement, possible rotator cuff repair, general nerve block.  Risks and benefits discussed with the patient, proceed on with scheduling based on her available.  She will be disabled obviously possibly sic weeks.   Would estimate disability status after work.  Also given prescription for Percocet #60, 5 mg. tablets as well as #60 Robaxin 750.  Chriss Czar 04/08/2015, 1:02 PM

## 2015-04-09 ENCOUNTER — Ambulatory Visit (HOSPITAL_BASED_OUTPATIENT_CLINIC_OR_DEPARTMENT_OTHER): Payer: 59 | Admitting: Anesthesiology

## 2015-04-09 ENCOUNTER — Ambulatory Visit (HOSPITAL_BASED_OUTPATIENT_CLINIC_OR_DEPARTMENT_OTHER)
Admission: RE | Admit: 2015-04-09 | Discharge: 2015-04-09 | Disposition: A | Discharge status: Home / Self Care | Payer: 59 | Source: Ambulatory Visit | Attending: Orthopedic Surgery | Admitting: Orthopedic Surgery

## 2015-04-09 ENCOUNTER — Encounter (HOSPITAL_BASED_OUTPATIENT_CLINIC_OR_DEPARTMENT_OTHER): Payer: Self-pay | Admitting: *Deleted

## 2015-04-09 ENCOUNTER — Encounter (HOSPITAL_BASED_OUTPATIENT_CLINIC_OR_DEPARTMENT_OTHER)
Admission: RE | Disposition: A | Discharge status: Home / Self Care | Payer: Self-pay | Source: Ambulatory Visit | Attending: Orthopedic Surgery

## 2015-04-09 DIAGNOSIS — I10 Essential (primary) hypertension: Secondary | ICD-10-CM | POA: Insufficient documentation

## 2015-04-09 DIAGNOSIS — M7542 Impingement syndrome of left shoulder: Secondary | ICD-10-CM | POA: Insufficient documentation

## 2015-04-09 DIAGNOSIS — M75121 Complete rotator cuff tear or rupture of right shoulder, not specified as traumatic: Secondary | ICD-10-CM | POA: Insufficient documentation

## 2015-04-09 DIAGNOSIS — M19011 Primary osteoarthritis, right shoulder: Secondary | ICD-10-CM | POA: Insufficient documentation

## 2015-04-09 HISTORY — PX: SHOULDER ARTHROSCOPY WITH OPEN ROTATOR CUFF REPAIR: SHX6092

## 2015-04-09 HISTORY — DX: Anxiety disorder, unspecified: F41.9

## 2015-04-09 HISTORY — DX: Gastro-esophageal reflux disease without esophagitis: K21.9

## 2015-04-09 HISTORY — DX: Hypothyroidism, unspecified: E03.9

## 2015-04-09 HISTORY — PX: SHOULDER ACROMIOPLASTY: SHX6093

## 2015-04-09 HISTORY — DX: Cardiac arrhythmia, unspecified: I49.9

## 2015-04-09 HISTORY — DX: Anemia, unspecified: D64.9

## 2015-04-09 HISTORY — DX: Other specified postprocedural states: Z98.890

## 2015-04-09 HISTORY — DX: Other specified postprocedural states: R11.2

## 2015-04-09 SURGERY — ARTHROSCOPY, SHOULDER WITH REPAIR, ROTATOR CUFF, OPEN
Anesthesia: Regional | Site: Shoulder | Laterality: Left

## 2015-04-09 MED ORDER — CEFAZOLIN SODIUM-DEXTROSE 2-3 GM-% IV SOLR
INTRAVENOUS | Status: AC
  Filled 2015-04-09: qty 50

## 2015-04-09 MED ORDER — MIDAZOLAM HCL 2 MG/2ML IJ SOLN
INTRAMUSCULAR | Status: AC
  Filled 2015-04-09: qty 2

## 2015-04-09 MED ORDER — GLYCOPYRROLATE 0.2 MG/ML IJ SOLN: 0.2000 mg | Freq: Once | INTRAMUSCULAR | Status: DC | PRN

## 2015-04-09 MED ORDER — LIDOCAINE HCL (CARDIAC) 20 MG/ML IV SOLN
INTRAVENOUS | Status: DC | PRN
  Administered 2015-04-09: 30 mg via INTRAVENOUS

## 2015-04-09 MED ORDER — SUCCINYLCHOLINE CHLORIDE 20 MG/ML IJ SOLN
INTRAMUSCULAR | Status: AC
  Filled 2015-04-09: qty 1

## 2015-04-09 MED ORDER — SODIUM CHLORIDE 0.9 % IV SOLN
INTRAVENOUS | Status: DC
Start: 2015-04-09 — End: 2015-04-09

## 2015-04-09 MED ORDER — DEXAMETHASONE SODIUM PHOSPHATE 10 MG/ML IJ SOLN
INTRAMUSCULAR | Status: AC
  Filled 2015-04-09: qty 1

## 2015-04-09 MED ORDER — FENTANYL CITRATE (PF) 100 MCG/2ML IJ SOLN
50.0000 ug | INTRAMUSCULAR | Status: DC | PRN
  Administered 2015-04-09: 100 ug via INTRAVENOUS

## 2015-04-09 MED ORDER — MIDAZOLAM HCL 2 MG/2ML IJ SOLN
INTRAMUSCULAR | Status: AC
  Filled 2015-04-09: qty 4

## 2015-04-09 MED ORDER — PROPOFOL 10 MG/ML IV BOLUS
INTRAVENOUS | Status: DC | PRN
  Administered 2015-04-09: 200 mg via INTRAVENOUS

## 2015-04-09 MED ORDER — SODIUM CHLORIDE 0.9 % IR SOLN
Status: DC | PRN
  Administered 2015-04-09: 1000 mL

## 2015-04-09 MED ORDER — CEFAZOLIN SODIUM-DEXTROSE 2-3 GM-% IV SOLR
2.0000 g | INTRAVENOUS | Status: AC
  Administered 2015-04-09: 2 g via INTRAVENOUS

## 2015-04-09 MED ORDER — SCOPOLAMINE 1 MG/3DAYS TD PT72
MEDICATED_PATCH | TRANSDERMAL | Status: AC
  Filled 2015-04-09: qty 1

## 2015-04-09 MED ORDER — SCOPOLAMINE 1 MG/3DAYS TD PT72
1.0000 | MEDICATED_PATCH | Freq: Once | TRANSDERMAL | Status: DC | PRN
  Administered 2015-04-09: 1.5 mg via TRANSDERMAL

## 2015-04-09 MED ORDER — SUCCINYLCHOLINE CHLORIDE 20 MG/ML IJ SOLN
INTRAMUSCULAR | Status: DC | PRN
  Administered 2015-04-09: 100 mg via INTRAVENOUS

## 2015-04-09 MED ORDER — ASPIRIN EC 325 MG PO TBEC: 325.0000 mg | DELAYED_RELEASE_TABLET | Freq: Two times a day (BID) | ORAL | Status: DC

## 2015-04-09 MED ORDER — FENTANYL CITRATE (PF) 100 MCG/2ML IJ SOLN
INTRAMUSCULAR | Status: AC
  Filled 2015-04-09: qty 2

## 2015-04-09 MED ORDER — MIDAZOLAM HCL 2 MG/2ML IJ SOLN
1.0000 mg | INTRAMUSCULAR | Status: DC | PRN
Start: 2015-04-09 — End: 2015-04-09
  Administered 2015-04-09: 1 mg via INTRAVENOUS
  Administered 2015-04-09: 2 mg via INTRAVENOUS

## 2015-04-09 MED ORDER — DEXAMETHASONE SODIUM PHOSPHATE 4 MG/ML IJ SOLN
INTRAMUSCULAR | Status: DC | PRN
  Administered 2015-04-09: 10 mg via INTRAVENOUS

## 2015-04-09 MED ORDER — ONDANSETRON HCL 4 MG/2ML IJ SOLN
INTRAMUSCULAR | Status: DC | PRN
  Administered 2015-04-09: 4 mg via INTRAVENOUS

## 2015-04-09 MED ORDER — CHLORHEXIDINE GLUCONATE 4 % EX LIQD: 60.0000 mL | Freq: Once | CUTANEOUS | Status: DC

## 2015-04-09 MED ORDER — ONDANSETRON HCL 4 MG/2ML IJ SOLN
INTRAMUSCULAR | Status: AC
  Filled 2015-04-09: qty 2

## 2015-04-09 MED ORDER — LACTATED RINGERS IV SOLN
INTRAVENOUS | Status: DC
  Administered 2015-04-09 (×2): via INTRAVENOUS

## 2015-04-09 SURGICAL SUPPLY — 78 items
ANCHOR SUT BIO SW 4.75X19.1 (Anchor) ×6 IMPLANT
BENZOIN TINCTURE PRP APPL 2/3 (GAUZE/BANDAGES/DRESSINGS) ×3 IMPLANT
BIT DRILL 7/64X5 DISP (BIT) ×3 IMPLANT
BLADE 4.2CUDA (BLADE) ×3 IMPLANT
BLADE AVERAGE 25X9 (BLADE) ×3 IMPLANT
BLADE CUTTER GATOR 3.5 (BLADE) IMPLANT
BLADE SURG 15 STRL LF DISP TIS (BLADE) ×2 IMPLANT
BLADE SURG 15 STRL SS (BLADE) ×1
BLADE VORTEX 6.0 (BLADE) IMPLANT
BUR 3.5 LG SPHERICAL (BURR) IMPLANT
BUR EGG 3PK/BX (BURR) ×3 IMPLANT
BUR OVAL 4.0 (BURR) IMPLANT
BUR OVAL 6.0 (BURR) ×3 IMPLANT
BUR VERTEX HOODED 4.5 (BURR) IMPLANT
BURR 3.5 LG SPHERICAL (BURR)
CANNULA SHOULDER 7CM (CANNULA) ×3 IMPLANT
CANNULA TWIST IN 8.25X7CM (CANNULA) IMPLANT
CLEANER CAUTERY TIP 5X5 PAD (MISCELLANEOUS) IMPLANT
CUTTER MENISCUS  4.2MM (BLADE)
CUTTER MENISCUS 4.2MM (BLADE) IMPLANT
DECANTER SPIKE VIAL GLASS SM (MISCELLANEOUS) IMPLANT
DRAPE STERI 35X30 U-POUCH (DRAPES) ×3 IMPLANT
DRAPE SURG 17X23 STRL (DRAPES) ×3 IMPLANT
DRAPE U-SHAPE 76X120 STRL (DRAPES) ×6 IMPLANT
DRSG EMULSION OIL 3X3 NADH (GAUZE/BANDAGES/DRESSINGS) ×3 IMPLANT
DRSG PAD ABDOMINAL 8X10 ST (GAUZE/BANDAGES/DRESSINGS) ×3 IMPLANT
DURAPREP 26ML APPLICATOR (WOUND CARE) ×3 IMPLANT
ELECT REM PT RETURN 9FT ADLT (ELECTROSURGICAL) ×3
ELECTRODE REM PT RTRN 9FT ADLT (ELECTROSURGICAL) ×2 IMPLANT
GAUZE SPONGE 4X4 12PLY STRL (GAUZE/BANDAGES/DRESSINGS) ×3 IMPLANT
GLOVE BIO SURGEON STRL SZ7.5 (GLOVE) ×3 IMPLANT
GLOVE BIOGEL PI IND STRL 7.0 (GLOVE) ×2 IMPLANT
GLOVE BIOGEL PI IND STRL 8 (GLOVE) ×4 IMPLANT
GLOVE BIOGEL PI INDICATOR 7.0 (GLOVE) ×1
GLOVE BIOGEL PI INDICATOR 8 (GLOVE) ×2
GLOVE ECLIPSE 6.5 STRL STRAW (GLOVE) ×3 IMPLANT
GLOVE SURG ORTHO 8.0 STRL STRW (GLOVE) ×6 IMPLANT
GOWN STRL REUS W/ TWL LRG LVL3 (GOWN DISPOSABLE) ×2 IMPLANT
GOWN STRL REUS W/ TWL XL LVL3 (GOWN DISPOSABLE) ×2 IMPLANT
GOWN STRL REUS W/TWL LRG LVL3 (GOWN DISPOSABLE) ×1
GOWN STRL REUS W/TWL XL LVL3 (GOWN DISPOSABLE) ×4 IMPLANT
MANIFOLD NEPTUNE II (INSTRUMENTS) ×3 IMPLANT
NEEDLE 1/2 CIR CATGUT .05X1.09 (NEEDLE) IMPLANT
NEEDLE SCORPION MULTI FIRE (NEEDLE) ×3 IMPLANT
NS IRRIG 1000ML POUR BTL (IV SOLUTION) ×3 IMPLANT
PACK ARTHROSCOPY DSU (CUSTOM PROCEDURE TRAY) ×3 IMPLANT
PACK BASIN DAY SURGERY FS (CUSTOM PROCEDURE TRAY) ×3 IMPLANT
PAD CLEANER CAUTERY TIP 5X5 (MISCELLANEOUS)
PAD ORTHO SHOULDER 7X19 LRG (SOFTGOODS) ×3 IMPLANT
PENCIL BUTTON HOLSTER BLD 10FT (ELECTRODE) IMPLANT
SET ARTHROSCOPY TUBING (MISCELLANEOUS) ×1
SET ARTHROSCOPY TUBING LN (MISCELLANEOUS) ×2 IMPLANT
SLING ARM LRG ADULT FOAM STRAP (SOFTGOODS) IMPLANT
SLING ARM MED ADULT FOAM STRAP (SOFTGOODS) IMPLANT
SLING ULTRA II MEDIUM (SOFTGOODS) IMPLANT
SLING ULTRA II SMALL (SOFTGOODS) IMPLANT
SPONGE LAP 4X18 X RAY DECT (DISPOSABLE) ×3 IMPLANT
STAPLER VISISTAT 35W (STAPLE) IMPLANT
STRIP CLOSURE SKIN 1/2X4 (GAUZE/BANDAGES/DRESSINGS) ×3 IMPLANT
SUCTION FRAZIER TIP 10 FR DISP (SUCTIONS) ×6 IMPLANT
SUT BONE WAX W31G (SUTURE) IMPLANT
SUT ETHILON 3 0 PS 1 (SUTURE) ×3 IMPLANT
SUT FIBERWIRE #2 38 T-5 BLUE (SUTURE)
SUT MNCRL AB 3-0 PS2 18 (SUTURE) ×3 IMPLANT
SUT TICRON 1 T 12 (SUTURE) ×6 IMPLANT
SUT TIGER TAPE 7 IN WHITE (SUTURE) ×3 IMPLANT
SUT VIC AB 0 CT1 27 (SUTURE)
SUT VIC AB 0 CT1 27XBRD ANBCTR (SUTURE) IMPLANT
SUT VIC AB 1 CT1 27 (SUTURE)
SUT VIC AB 1 CT1 27XBRD ANBCTR (SUTURE) IMPLANT
SUT VIC AB 2-0 SH 27 (SUTURE) ×1
SUT VIC AB 2-0 SH 27XBRD (SUTURE) ×2 IMPLANT
SUTURE FIBERWR #2 38 T-5 BLUE (SUTURE) IMPLANT
TAPE FIBER 2MM 7IN #2 BLUE (SUTURE) ×3 IMPLANT
TOWEL OR 17X24 6PK STRL BLUE (TOWEL DISPOSABLE) ×3 IMPLANT
WAND STAR VAC 90 (SURGICAL WAND) IMPLANT
WATER STERILE IRR 1000ML POUR (IV SOLUTION) ×3 IMPLANT
YANKAUER SUCT BULB TIP NO VENT (SUCTIONS) ×3 IMPLANT

## 2015-04-09 NOTE — Anesthesia Postprocedure Evaluation (Signed)
  Anesthesia Post-op Note  Patient: Regina Daniels  Procedure(s) Performed: Procedure(s): LEFT SHOULDER ARTHROSCOPY DEBRIDEMENT ,  with open acromioplasty and rotator cuff repair (Left) SHOULDER ACROMIOPLASTY (Left)  Patient Location: PACU  Anesthesia Type: General, Regional   Level of Consciousness: awake, alert  and oriented  Airway and Oxygen Therapy: Patient Spontanous Breathing  Post-op Pain: none  Post-op Assessment: Post-op Vital signs reviewed  Post-op Vital Signs: Reviewed  Last Vitals:  Filed Vitals:   04/09/15 1500  BP: 157/77  Pulse: 51  Temp:   Resp: 18    Complications: No apparent anesthesia complications

## 2015-04-09 NOTE — Anesthesia Procedure Notes (Addendum)
Procedure Name: Intubation Date/Time: 04/09/2015 12:50 PM Performed by: BLOCKER, TIMOTHY D Pre-anesthesia Checklist: Patient identified, Emergency Drugs available, Suction available and Patient being monitored Patient Re-evaluated:Patient Re-evaluated prior to inductionOxygen Delivery Method: Circle System Utilized Preoxygenation: Pre-oxygenation with 100% oxygen Intubation Type: IV induction Ventilation: Mask ventilation without difficulty Laryngoscope Size: Mac and 3 Grade View: Grade I Tube type: Oral Number of attempts: 1 Airway Equipment and Method: Stylet and Oral airway Placement Confirmation: ETT inserted through vocal cords under direct vision,  positive ETCO2 and breath sounds checked- equal and bilateral Tube secured with: Tape Dental Injury: Teeth and Oropharynx as per pre-operative assessment    Anesthesia Regional Block:  Interscalene brachial plexus block  Pre-Anesthetic Checklist: ,, timeout performed, Correct Patient, Correct Site, Correct Laterality, Correct Procedure, Correct Position, site marked, Risks and benefits discussed,  Surgical consent,  Pre-op evaluation,  At surgeon's request and post-op pain management  Laterality: Left and Upper  Prep: chloraprep       Needles:  Injection technique: Single-shot  Needle Type: Echogenic Needle     Needle Length: 5cm 5 cm Needle Gauge: 21 and 21 G    Additional Needles:  Procedures: ultrasound guided (picture in chart) Interscalene brachial plexus block Narrative:  Start time: 04/21/2015 12:00 PM End time: 04/21/2015 12:06 PM Injection made incrementally with aspirations every 5 mL.  Performed by: Personally  Anesthesiologist: CREWS, DAVID

## 2015-04-09 NOTE — Transfer of Care (Signed)
Immediate Anesthesia Transfer of Care Note  Patient: Regina Daniels  Procedure(s) Performed: Procedure(s): LEFT SHOULDER ARTHROSCOPY DEBRIDEMENT ,  with open acromioplasty and rotator cuff repair (Left) SHOULDER ACROMIOPLASTY (Left)  Patient Location: PACU  Anesthesia Type:GA combined with regional for post-op pain  Level of Consciousness: awake, alert , oriented and patient cooperative  Airway & Oxygen Therapy: Patient Spontanous Breathing and Patient connected to face mask oxygen  Post-op Assessment: Report given to RN and Post -op Vital signs reviewed and stable  Post vital signs: Reviewed and stable  Last Vitals:  Filed Vitals:   04/09/15 1425  BP:   Pulse: 63  Temp:   Resp:     Complications: No apparent anesthesia complications

## 2015-04-09 NOTE — Brief Op Note (Signed)
04/09/2015  2:19 PM  PATIENT:  Regina Daniels  59 y.o. female  PRE-OPERATIVE DIAGNOSIS:  Impingement syndrome of left shoulder primary osteoarthritis left shoulder complete rotator cuff tear or rupture of left shoulder not specified as traumatic   POST-OPERATIVE DIAGNOSIS:  impingement syndrome of left shoulder primary osteoarthritis left shoulder complete rotator cuff tear or rupture of left shoulder not specified as traumatic   PROCEDURE:  Procedure(s): LEFT SHOULDER ARTHROSCOPY DEBRIDEMENT ,  with open acromioplasty and rotator cuff repair (Left) SHOULDER ACROMIOPLASTY (Left)  SURGEON:  Surgeon(s) and Role:    * Frederico Hamman, MD - Primary  PHYSICIAN ASSISTANT: Margart Sickles, PA-C  ASSISTANTS:    ANESTHESIA:   regional and general  EBL:  Total I/O In: 1200 [I.V.:1200] Out: -   BLOOD ADMINISTERED:none  DRAINS: none   LOCAL MEDICATIONS USED:  NONE  SPECIMEN:  No Specimen  DISPOSITION OF SPECIMEN:  N/A  COUNTS:  YES  TOURNIQUET:  * No tourniquets in log *  DICTATION: .Other Dictation: Dictation Number unknown  PLAN OF CARE: Discharge to home after PACU  PATIENT DISPOSITION:  PACU - hemodynamically stable.   Delay start of Pharmacological VTE agent (>24hrs) due to surgical blood loss or risk of bleeding: not applicable

## 2015-04-09 NOTE — Anesthesia Preprocedure Evaluation (Signed)
Anesthesia Evaluation  Patient identified by MRN, date of birth, ID band Patient awake    Reviewed: Allergy & Precautions, NPO status , Patient's Chart, lab work & pertinent test results  History of Anesthesia Complications (+) PONV  Airway Mallampati: I  TM Distance: >3 FB Neck ROM: Full    Dental  (+) Teeth Intact, Dental Advisory Given   Pulmonary    breath sounds clear to auscultation       Cardiovascular hypertension, Pt. on medications  Rhythm:Regular Rate:Normal     Neuro/Psych    GI/Hepatic GERD  Medicated and Controlled,  Endo/Other    Renal/GU      Musculoskeletal   Abdominal   Peds  Hematology   Anesthesia Other Findings   Reproductive/Obstetrics                             Anesthesia Physical Anesthesia Plan  ASA: II  Anesthesia Plan: General and Regional   Post-op Pain Management:    Induction: Intravenous  Airway Management Planned: Oral ETT  Additional Equipment:   Intra-op Plan:   Post-operative Plan: Extubation in OR  Informed Consent: I have reviewed the patients History and Physical, chart, labs and discussed the procedure including the risks, benefits and alternatives for the proposed anesthesia with the patient or authorized representative who has indicated his/her understanding and acceptance.   Dental advisory given  Plan Discussed with: CRNA, Anesthesiologist and Surgeon  Anesthesia Plan Comments:         Anesthesia Quick Evaluation

## 2015-04-09 NOTE — Progress Notes (Signed)
Assisted Dr. Crews with left, ultrasound guided, interscalene  block. Side rails up, monitors on throughout procedure. See vital signs in flow sheet. Tolerated Procedure well. 

## 2015-04-09 NOTE — Discharge Instructions (Signed)
Diet: As you were doing prior to hospitalization   Activity: Increase activity slowly as tolerated  No lifting or driving for 6 weeks   Shower: May shower without a dressing on post op day #4, NO SOAKING in tub   Dressing: You may change your dressing on post op day #4.  Then change the dressing daily with sterile 4"x4"s gauze dressing    Weight Bearing/Sling: nonweight bearing left arm, remain in sling except pendulum exercises and shower (keep arm close to body)  To prevent constipation: you may use a stool softener such as -  Colace ( over the counter) 100 mg by mouth twice a day  Drink plenty of fluids ( prune juice may be helpful) and high fiber foods  Miralax ( over the counter) for constipation as needed.   Precautions: If you experience chest pain or shortness of breath - call 911 immediately For transfer to the hospital emergency department!!  If you develop a fever greater that 101 F, purulent drainage from wound, increased redness or drainage from wound, or calf pain -- Call the office   Follow- Up Appointment: Please call for an appointment to be seen in 1 week or as previously scheduled  [facility] - [phone]  Call your surgeon if you experience:   1.  Fever over 101.0. 2.  Inability to urinate. 3.  Nausea and/or vomiting. 4.  Extreme swelling or bruising at the surgical site. 5.  Continued bleeding from the incision. 6.  Increased pain, redness or drainage from the incision. 7.  Problems related to your pain medication. 8. Any change in color, movement and/or sensation 9. Any problems and/or concerns  Regional Anesthesia Blocks  1. Numbness or the inability to move the "blocked" extremity may last from 3-48 hours after placement. The length of time depends on the medication injected and your individual response to the medication. If the numbness is not going away after 48 hours, call your surgeon.  2. The extremity that is blocked will need to be protected  until the numbness is gone and the  Strength has returned. Because you cannot feel it, you will need to take extra care to avoid injury. Because it may be weak, you may have difficulty moving it or using it. You may not know what position it is in without looking at it while the block is in effect.  3. For blocks in the legs and feet, returning to weight bearing and walking needs to be done carefully. You will need to wait until the numbness is entirely gone and the strength has returned. You should be able to move your leg and foot normally before you try and bear weight or walk. You will need someone to be with you when you first try to ensure you do not fall and possibly risk injury.  4. Bruising and tenderness at the needle site are common side effects and will resolve in a few days.  5. Persistent numbness or new problems with movement should be communicated to the surgeon or the [facility] [phone] [facility] [phone]).   Post Anesthesia Home Care Instructions  Activity: Get plenty of rest for the remainder of the day. A responsible adult should stay with you for 24 hours following the procedure.  For the next 24 hours, DO NOT: -Drive a car -Advertising copywriter -Drink alcoholic beverages -Take any medication unless instructed by your physician -Make any legal decisions or sign important papers.  Meals: Start with liquid  foods such as gelatin or soup. Progress to regular foods as tolerated. Avoid greasy, spicy, heavy foods. If nausea and/or vomiting occur, drink only clear liquids until the nausea and/or vomiting subsides. Call your physician if vomiting continues.  Special Instructions/Symptoms: Your throat may feel dry or sore from the anesthesia or the breathing tube placed in your throat during surgery. If this causes discomfort, gargle with warm salt water. The discomfort should disappear within 24 hours.  If you had a scopolamine patch placed  behind your ear for the management of post- operative nausea and/or vomiting:  1. The medication in the patch is effective for 72 hours, after which it should be removed.  Wrap patch in a tissue and discard in the trash. Wash hands thoroughly with soap and water. 2. You may remove the patch earlier than 72 hours if you experience unpleasant side effects which may include dry mouth, dizziness or visual disturbances. 3. Avoid touching the patch. Wash your hands with soap and water after contact with the patch.

## 2015-04-10 ENCOUNTER — Encounter (HOSPITAL_BASED_OUTPATIENT_CLINIC_OR_DEPARTMENT_OTHER): Payer: Self-pay | Admitting: Orthopedic Surgery

## 2015-04-10 NOTE — Op Note (Signed)
Regina Daniels, Regina Daniels NO.:  [id number]  MEDICAL RECORD NO.:  [id number]  LOCATION:                               FACILITY:  [facility]  PHYSICIAN:  Dyke Brackett, M.D.    DATE OF BIRTH:  [birth year]  DATE OF PROCEDURE:  04/09/2015 DATE OF DISCHARGE:  04/09/2015                              OPERATIVE REPORT   INDICATIONS:  A 59 year old female with intractable shoulder pain.  MRI proven left shoulder rotator cuff tear thought to be amenable to outpatient surgery.  PREOPERATIVE DIAGNOSES: 1. Complete chronic rotator cuff tear, right shoulder. 2. Impingement. 3. Degenerative and anterosuperior labrum.  POSTOPERATIVE DIAGNOSES: 1. Complete chronic rotator cuff tear, right shoulder. 2. Impingement. 3. Degenerative and anterosuperior labrum.  OPERATION: 1. Arthroscopic debridement (extensive). 2. Open rotator cuff repair acromioplasty, left shoulder (chronic).  SURGEON:  Dyke Brackett, M.D.  ASSISTANT:  [place] Well, PA  ESTIMATED BLOOD LOSS:  Minimal.  General with a nerve block.  DESCRIPTION OF PROCEDURE:  Arthroscoped in beach chair position to an anterior posterior portal systematic inspection shoulder showed significant tearing into the anterior superior labrum.  Degenerative tearing no significant glenohumeral degenerative change was noted. There was some mild fissuring of the glenoid itself, which was debrided as well as degenerative tearing of the labrum.  Full-thickness cuff tear measured about 2-3 cm was noted.  We elected after extensive debridement arthroscopically due an incision, open cuff repair with incision in-line with the acromion.  AC joint was not violated.  Acromion was mildly thickened.  We performed acromioplasty bursectomy probably a 3-4 cm tear was encountered.  We freshened tuberosity of the edges of the cuff tear and a 15 blade and then repaired using the mediolateral row technique specifically 2 fiber tapes 2 medial rows 4.5,  SwiveLock double-armed and a single a swivel lock distally for a total of 4 tapes to repair the tear extension were type tear with repair was obtained.  The repair was under no undue tension.  The deltoid was meticulous repair with #1 Tycron, the subcu tissues in 020 Vicryl and skin with Monocryl.  Lightly compressive sterile dressing mini pillow splint applied taken recovery in stable condition scarification dictation Deborah gland.     Dyke Brackett, M.D.     WDC/MEDQ  D:  04/09/2015  T:  04/09/2015  Job:  [id number]

## 2015-04-21 MED ORDER — BUPIVACAINE-EPINEPHRINE (PF) 0.5% -1:200000 IJ SOLN
INTRAMUSCULAR | Status: DC | PRN
  Administered 2015-04-09: 25 mL via PERINEURAL

## 2015-04-21 NOTE — Addendum Note (Signed)
Addendum  created 04/21/15 0702 by Sheldon Silvan, MD   Modules edited: Anesthesia Blocks and Procedures, Anesthesia Medication Administration, Clinical Notes   Clinical Notes:  File: [id number]; File: [id number]

## 2015-05-19 ENCOUNTER — Other Ambulatory Visit: Payer: Self-pay

## 2015-05-19 ENCOUNTER — Ambulatory Visit (INDEPENDENT_AMBULATORY_CARE_PROVIDER_SITE_OTHER): Payer: 59 | Admitting: Endocrinology

## 2015-05-19 ENCOUNTER — Encounter: Payer: Self-pay | Admitting: Endocrinology

## 2015-05-19 VITALS — BP 132/84 | HR 61 | Temp 98.5°F | Ht 66.0 in | Wt 203.0 lb

## 2015-05-19 DIAGNOSIS — E042 Nontoxic multinodular goiter: Secondary | ICD-10-CM | POA: Diagnosis not present

## 2015-05-19 LAB — T4, FREE: FREE T4: 0.87 ng/dL (ref 0.60–1.60)

## 2015-05-19 LAB — TSH: TSH: 0.93 u[IU]/mL (ref 0.35–4.50)

## 2015-05-19 NOTE — Progress Notes (Signed)
Patient ID: Regina Daniels, female   DOB: [birth year], 59 y.o.   MRN: [id number]           Reason for Appointment: Evaluation of thyroid nodule    History of Present Illness:   The patient's thyroid nodule was first discovered in 2008 The largest nodule was left side, this was endogenous and measuring 3.2 cm At that time she had 2 other nodules on the right side and had biopsy done on all of the large nodules on both sides and was told that the results were benign  She says that periodically she has swelling and discomfort on the left side of her neck  She normally does not feel any swelling and she thinks her thyroid nodule on the left swells up.  She is usually given a steroid dose back by her PCP which helps She last had treatment with the steroid dose back in April More recently she started having discomfort and felt nodule swelling about 3 weeks ago but has not had any steroids.  She has had some improvement in the discomfort and swelling since then  She also has been followed with periodic ultrasound exams, most recent one in 4/16, previously in 6/12  The last thyroid ultrasound showed the following   Right thyroid lobe  Measurements: 4.8 x 2.1 x 1.8 cm. Solid nodule in the posterior mid right thyroid lobe measures 1.0 x 1.1 x 0.9 cm. There is a second solid nodule in the inferior right thyroid lobe that measures 0.9 cm in the transverse dimension. Difficult to compare these nodules with nodules from the previous examination. There is a 0.5 cm nodule along the medial inferior aspect of the right thyroid lobe.  Left thyroid lobe  Measurements: 5.4 x 2.5 x 1.9 cm. there is a complex nodule in the inferior left thyroid lobe that measures 3.1 x 1.5 x 1.7 cm. Previously, this nodule measured 3.2 x 1.8 x 2.3 cm. This complex nodule has solid and cystic components.  Isthmus  Thickness: 0.6 cm. Small 0.4 cm nodule in the isthmus. There is also a solid hypoechoic nodule in the  isthmus measuring up to 0.8 cm and previously measured 1.3 cm in the transverse dimension  HYPOTHYROIDISM:  She thinks that several years ago she had symptoms of feeling fatigued, had cold intolerance but no other symptoms that she remembers and she was told that her thyroid was underactive. She was started on levothyroxine 25 g and this was subsequently increased to 50 g She thinks she felt better with her supplement. Currently she does not feel unusually tired or cold  No labs available from PCP at this time     Medication List       This list is accurate as of: 05/19/15 11:59 PM.  Always use your most recent med list.               aspirin EC 325 MG tablet  Take 1 tablet (325 mg total) by mouth 2 (two) times daily. For 2 weeks then may discontinue and resume normal 81mg  aspirin     ATENOLOL PO  Take 50 mg by mouth daily.     cetirizine 10 MG tablet  Commonly known as:  ZYRTEC  Take 10 mg by mouth daily.     fluticasone 50 MCG/ACT nasal spray  Commonly known as:  FLONASE  Daily.     levothyroxine 50 MCG tablet  Commonly known as:  SYNTHROID, LEVOTHROID  Take 50 mcg by mouth daily  before breakfast.     methocarbamol 500 MG tablet  Commonly known as:  ROBAXIN  Take 500 mg by mouth 4 (four) times daily.     oxyCODONE-acetaminophen 5-325 MG tablet  Commonly known as:  PERCOCET/ROXICET  Take by mouth every 4 (four) hours as needed for severe pain.     pantoprazole 40 MG tablet  Commonly known as:  PROTONIX  Take 40 mg by mouth daily.     potassium chloride 10 MEQ tablet  Commonly known as:  K-DUR,KLOR-CON  Take 10 mEq by mouth daily.     PREMARIN 0.625 MG tablet  Generic drug:  estrogens (conjugated)     triamterene-hydrochlorothiazide 75-50 MG tablet  Commonly known as:  MAXZIDE  Take 1 tablet by mouth daily.        Allergies:  Allergies  Allergen Reactions  . Demerol Hives and Rash    All over body  . Epinephrine Palpitations    Past Medical  History  Diagnosis Date  . Arthritis   . Chronic headaches     Sinus  . Groin abscess   . Allergy   . Sleep difficulties   . Thyroid disease     hypothyroidism  . Dysrhythmia     palpitations  . Hypothyroidism   . Anxiety   . GERD (gastroesophageal reflux disease)   . Anemia   . PONV (postoperative nausea and vomiting)   . Hypertension     since age 58    There is no history of radiation to the neck in childhood  Past Surgical History  Procedure Laterality Date  . Abdominal hysterectomy  1993  . Breast surgery  1993 and 1994    Lt lumpectomy  . Temporomandibular joint surgery  2002  . Incise and drain abcess      abscess left groin  . Tubal ligation    . Inguinal hidradenitis excision  2012    bil  . Shoulder arthroscopy with open rotator cuff repair Left 04/09/2015    Procedure: LEFT SHOULDER ARTHROSCOPY DEBRIDEMENT ,  with open acromioplasty and rotator cuff repair;  Surgeon: Frederico Hamman, MD;  Location: [facility];  Service: Orthopedics;  Laterality: Left;  . Shoulder acromioplasty Left 04/09/2015    Procedure: SHOULDER ACROMIOPLASTY;  Surgeon: Frederico Hamman, MD;  Location: [facility];  Service: Orthopedics;  Laterality: Left;    Family History  Problem Relation Age of Onset  . Heart disease Mother   . Diabetes Mother   . Stroke Mother   . Thyroid disease Mother   . Cancer Father   . Heart disease Father   . Diabetes Father   . Stroke Father   . Thyroid disease Sister     Social History:  reports that she has never smoked. She does not have any smokeless tobacco history on file. She reports that she does not drink alcohol or use illicit drugs.   Review of Systems:  Review of Systems  Constitutional: Negative for weight loss and malaise/fatigue.  Respiratory: Negative for shortness of breath.   Gastrointestinal: Negative for constipation.  Musculoskeletal: Negative for joint pain.             Examination:   BP  132/84 mmHg  Pulse 61  Temp(Src) 98.5 F (36.9 C) (Oral)  Ht  (1.676 m)  Wt 203 lb (92.08 kg)  BMI 32.78 kg/m2  SpO2 99%   General Appearance:  well-looking        Eyes: No abnormal prominence or  swelling of the eyes         THYROID: Thyroid nodule is palpable on the left side, near the isthmus and is about 3 cm, minimally tender and smooth, slightly firm.  Right lower thyroid is enlarged about 1-1/2-2 times normal, no distinct nodules felt, firm and nontender  There is no lymphadenopathy in the neck  Heart sounds normal Lungs clear, no stridor Abdomen exam not indicated Reflexes at biceps are normal.  Skin: No rash or lesions Extremities: No edema  Assessment/Plan:  MULTINODULAR goiter:  She has had long-standing multinodular goiter since 2008 with benign nodules Her nodule on the left side is overall unchanged in size since initial diagnosis and appears to have had some cystic degeneration since initial diagnosis Discussed with the patient that the episodes of discomfort and swelling with a nodular either related to increased cystic component or minor hemorrhage inside the nodule Unlikely that she has episodes of subacute thyroiditis in the left side and would not recommend taking steroids since her symptoms are usually mild and self-limiting Since she has had benign nodules does not need any further ultrasound unless there is a significant change in clinical exam  She will follow up in 4 months.  HYPOTHYROIDISM: She reportedly had high TSH level that diagnoses but no records are available from PCP No recent TSH has been done and will check one today   Frances Ambrosino 05/20/2015  Addendum: Thyroid levels are normal, to continue 50 g  Lab Results  Component Value Date   TSH 0.93 05/19/2015   FREET4 0.87 05/19/2015

## 2015-05-20 NOTE — Progress Notes (Signed)
Quick Note:  Please let patient know that the thyroid levels are normal, continue same levothyroxine dose ______

## 2015-06-18 ENCOUNTER — Telehealth: Payer: Self-pay | Admitting: Endocrinology

## 2015-06-18 NOTE — Telephone Encounter (Signed)
Notes faxed to PCP.

## 2015-06-18 NOTE — Telephone Encounter (Signed)
Dr Windy Fastonald office (Primary physician ) called ask for patient office notes from last visit, phone # [phone]

## 2015-09-16 ENCOUNTER — Encounter: Payer: Self-pay | Admitting: Endocrinology

## 2015-09-16 ENCOUNTER — Ambulatory Visit (INDEPENDENT_AMBULATORY_CARE_PROVIDER_SITE_OTHER): Payer: 59 | Admitting: Endocrinology

## 2015-09-16 VITALS — BP 124/78 | HR 45 | Temp 98.6°F | Resp 14 | Ht 66.0 in | Wt 207.4 lb

## 2015-09-16 DIAGNOSIS — E042 Nontoxic multinodular goiter: Secondary | ICD-10-CM

## 2015-09-16 NOTE — Progress Notes (Signed)
Patient ID: Regina Daniels, female   DOB: 03-02-1956, 61 y.o.   MRN: [id number]           Reason for Appointment: Follow-up of goiter    History of Present Illness:   The patient's multinodular goiter was first discovered in 2008 The largest nodule was left side, this was endogenous and measuring 3.2 cm At that time she had 2 other nodules on the right side and had biopsy done on all of the large nodules on both sides and was told that the results were benign She previously had periodic swelling and discomfort on the left side of her neck treated by PCP with steroids with some relief However she has not had any further episodes since her last visit  She also has been followed with periodic ultrasound exams, most recent one in 4/16, previously in 6/12  The last thyroid ultrasound showed the following   Right thyroid lobe  Measurements: 4.8 x 2.1 x 1.8 cm. Solid nodule in the posterior mid right thyroid lobe measures 1.0 x 1.1 x 0.9 cm. There is a second solid nodule in the inferior right thyroid lobe that measures 0.9 cm in the transverse dimension. Difficult to compare these nodules with nodules from the previous examination. There is a 0.5 cm nodule along the medial inferior aspect of the right thyroid lobe.  Left thyroid lobe  Measurements: 5.4 x 2.5 x 1.9 cm. there is a complex nodule in the inferior left thyroid lobe that measures 3.1 x 1.5 x 1.7 cm. Previously, this nodule measured 3.2 x 1.8 x 2.3 cm. This complex nodule has solid and cystic components.  Isthmus  Thickness: 0.6 cm. Small 0.4 cm nodule in the isthmus. There is also a solid hypoechoic nodule in the isthmus measuring up to 0.8 cm and previously measured 1.3 cm in the transverse dimension  HYPOTHYROIDISM:  She thinks that several years ago she had symptoms of feeling fatigued, had cold intolerance but no other symptoms that she remembers and she was told that her thyroid was underactive. She was started on  levothyroxine 25 g and this was subsequently increased to 50 g She thinks she felt better with her supplement. Her last thyroid level was as follows, currently she has no symptoms of fatigue or unusual weight change   Lab Results  Component Value Date   TSH 0.93 05/19/2015   FREET4 0.87 05/19/2015          Medication List       This list is accurate as of: 09/16/15 11:59 PM.  Always use your most recent med list.               ATENOLOL PO  Take 50 mg by mouth daily.     cetirizine 10 MG tablet  Commonly known as:  ZYRTEC  Take 10 mg by mouth daily.     fluticasone 50 MCG/ACT nasal spray  Commonly known as:  FLONASE  Daily.     levothyroxine 50 MCG tablet  Commonly known as:  SYNTHROID, LEVOTHROID  Take 50 mcg by mouth daily before breakfast.     meloxicam 15 MG tablet  Commonly known as:  MOBIC  TAKE 1 TABLET BY MOUTH DAILY WITH FOOD FOR 14 DAYS THEN AS NEEDED     methocarbamol 500 MG tablet  Commonly known as:  ROBAXIN  Take 500 mg by mouth 4 (four) times daily.     oxyCODONE-acetaminophen 5-325 MG tablet  Commonly known as:  PERCOCET/ROXICET  Take  by mouth every 4 (four) hours as needed for severe pain.     pantoprazole 40 MG tablet  Commonly known as:  PROTONIX  Take 40 mg by mouth daily.     potassium chloride 10 MEQ tablet  Commonly known as:  K-DUR,KLOR-CON  Take 10 mEq by mouth daily.     PREMARIN 0.625 MG tablet  Generic drug:  estrogens (conjugated)     triamterene-hydrochlorothiazide 75-50 MG tablet  Commonly known as:  MAXZIDE  Take 1 tablet by mouth daily.        Allergies:  Allergies  Allergen Reactions  . Demerol Hives and Rash    All over body  . Epinephrine Palpitations    Past Medical History  Diagnosis Date  . Arthritis   . Chronic headaches     Sinus  . Groin abscess   . Allergy   . Sleep difficulties   . Thyroid disease     hypothyroidism  . Dysrhythmia     palpitations  . Hypothyroidism   . Anxiety   .  GERD (gastroesophageal reflux disease)   . Anemia   . PONV (postoperative nausea and vomiting)   . Hypertension     since age 75    There is no history of radiation to the neck in childhood  Past Surgical History  Procedure Laterality Date  . Abdominal hysterectomy  1993  . Breast surgery  1993 and 1994    Lt lumpectomy  . Temporomandibular joint surgery  2002  . Incise and drain abcess      abscess left groin  . Tubal ligation    . Inguinal hidradenitis excision  2012    bil  . Shoulder arthroscopy with open rotator cuff repair Left 04/09/2015    Procedure: LEFT SHOULDER ARTHROSCOPY DEBRIDEMENT ,  with open acromioplasty and rotator cuff repair;  Surgeon: Frederico Hamman, MD;  Location: [facility];  Service: Orthopedics;  Laterality: Left;  . Shoulder acromioplasty Left 04/09/2015    Procedure: SHOULDER ACROMIOPLASTY;  Surgeon: Frederico Hamman, MD;  Location: [facility];  Service: Orthopedics;  Laterality: Left;    Family History  Problem Relation Age of Onset  . Heart disease Mother   . Diabetes Mother   . Stroke Mother   . Thyroid disease Mother   . Cancer Father   . Heart disease Father   . Diabetes Father   . Stroke Father   . Thyroid disease Sister     Social History:  reports that she has never smoked. She does not have any smokeless tobacco history on file. She reports that she does not drink alcohol or use illicit drugs.   Review of Systems:  Review of Systems  Constitutional: Negative for weight loss and malaise/fatigue.  Respiratory: Negative for shortness of breath.   Gastrointestinal: Negative for constipation.  Musculoskeletal: Negative for joint pain.             Examination:   BP 124/78 mmHg  Pulse 45  Temp(Src) 98.6 F (37 C)  Resp 14  Ht  (1.676 m)  Wt 207 lb 6.4 oz (94.076 kg)  BMI 33.49 kg/m2  SpO2 97%          THYROID: Thyroid nodule is palpable on the left side, near the isthmus and is about 2.5  cm, somewhat flattened out and not easily palpable. Right lower thyroid is enlarged about 1-1/2-2 times normal, no distinct nodules felt, firm and nontender Reflexes at biceps are normal.  Assessment/Plan:  MULTINODULAR goiter:  She has had long-standing multinodular goiter since 2008 with benign nodules Her nodule on the left side is overall clinically-appearing smaller on this visit. Has had some cystic degeneration present in this previously Also has not had any recurrent episodes of local pain which she was having  Since she has had benign nodules does not need any further ultrasound unless there is a significant change in clinical exam  She will follow up with her PCP now  HYPOTHYROIDISM: She reportedly had high TSH level initially and this will be again followed up by PCP, she is currently on smaller doses only   [facility] 09/17/2015

## 2015-12-09 ENCOUNTER — Ambulatory Visit (INDEPENDENT_AMBULATORY_CARE_PROVIDER_SITE_OTHER): Payer: 59 | Admitting: Allergy and Immunology

## 2015-12-09 ENCOUNTER — Encounter: Payer: Self-pay | Admitting: Allergy and Immunology

## 2015-12-09 VITALS — BP 120/80 | HR 64 | Resp 16

## 2015-12-09 DIAGNOSIS — J3089 Other allergic rhinitis: Secondary | ICD-10-CM

## 2015-12-09 MED ORDER — AZELASTINE-FLUTICASONE 137-50 MCG/ACT NA SUSP: 1.0000 | Freq: Two times a day (BID) | NASAL | Status: DC

## 2015-12-09 NOTE — Assessment & Plan Note (Addendum)
   Continue/complete the course of antibiotics as prescribed by her primary care physician.  A prescription has been provided for Dymista (azelastine/fluticasone) nasal spray, 1 spray per nostril twice daily as needed. Proper nasal spray technique has been discussed and demonstrated.  Nasal saline lavage (NeilMed) as needed has been recommended along with instructions for proper administration.  At the first symptoms of sinus infection, she may add guaifenesin 1200 mg twice a day with adequate hydration and use oxymetazoline (Afrin) for one or 2 days.  She has been warned and has verbalized understanding regarding the rebound's effects of oxymetazoline if she uses it for more than 3 days.  If allergen avoidance measures and medications fail to adequately relieve symptoms, we will consider allergy retest in anticipation of aeroallergen immunotherapy.

## 2015-12-09 NOTE — Patient Instructions (Addendum)
Allergic rhinosinusitis  Continue/complete the course of antibiotics as prescribed by her primary care physician.  A prescription has been provided for Dymista (azelastine/fluticasone) nasal spray, 1 spray per nostril twice daily as needed. Proper nasal spray technique has been discussed and demonstrated.  Nasal saline lavage (NeilMed) as needed has been recommended along with instructions for proper administration.  At the first symptoms of sinus infection, she may add guaifenesin 1200 mg twice a day with adequate hydration and use oxymetazoline (Afrin) for one or 2 days.  She has been warned and has verbalized understanding regarding the rebound's effects of oxymetazoline if she uses it for more than 3 days.  If allergen avoidance measures and medications fail to adequately relieve symptoms, we will consider allergy retest in anticipation of aeroallergen immunotherapy.    Return in about 6 months (around 06/10/2016), or if symptoms worsen or fail to improve.

## 2015-12-09 NOTE — Progress Notes (Signed)
Follow-up Note  RE: ISATU MACINNES MRN: [id number] DOB: 10-28-1955 Date of Office Visit: 12/09/2015  Primary care provider: Lorenda Peck, MD Referring provider: Burton Apley, MD  History of present illness: HPI Comments: Regina Daniels is a 60 y.o. female with allergic rhinosinusitis who presents today for follow up.  She was last seen in this clinic in September 2015.  She had septoplasty and turbinate reduction 2 years ago with initial improvement of nasal congestion and sinus pressure.  However, rhinorrhea has persisted and since April she has had 2 sinus infections requiring antibiotics.  She is still on Keflex for the more recent sinus infection.  Her symptoms resolved while on the antibiotics.  Skin testing in 2015 revealed sensitivity to tree pollens and molds.  She currently attempts to control her nasal/sinus symptoms with fluticasone nasal spray, montelukast daily, and an over-the-counter antihistamine as needed.   Assessment and plan: Allergic rhinosinusitis  Continue/complete the course of antibiotics as prescribed by her primary care physician.  A prescription has been provided for Dymista (azelastine/fluticasone) nasal spray, 1 spray per nostril twice daily as needed. Proper nasal spray technique has been discussed and demonstrated.  Nasal saline lavage (NeilMed) as needed has been recommended along with instructions for proper administration.  At the first symptoms of sinus infection, she may add guaifenesin 1200 mg twice a day with adequate hydration and use oxymetazoline (Afrin) for one or 2 days.  She has been warned and has verbalized understanding regarding the rebound's effects of oxymetazoline if she uses it for more than 3 days.  If allergen avoidance measures and medications fail to adequately relieve symptoms, we will consider allergy retest in anticipation of aeroallergen immunotherapy.    Meds ordered this encounter  Medications  .  Azelastine-Fluticasone (DYMISTA) 137-50 MCG/ACT SUSP    Sig: Place 1 spray into both nostrils 2 (two) times daily.    Dispense:  1 Bottle    Refill:  5      Physical examination: Blood pressure 120/80, pulse 64, resp. rate 16.  General: Alert, interactive, in no acute distress. HEENT: TMs pearly gray, turbinates moderately edematous without discharge, post-pharynx moderately erythematous. Neck: Supple without lymphadenopathy. Lungs: Clear to auscultation without wheezing, rhonchi or rales. CV: Normal S1, S2 without murmurs. Skin: Warm and dry, without lesions or rashes.  The following portions of the patient's history were reviewed and updated as appropriate: allergies, current medications, past family history, past medical history, past social history, past surgical history and problem list.    Medication List       This list is accurate as of: 12/09/15  7:50 PM.  Always use your most recent med list.               ATENOLOL PO  Take 25 mg by mouth daily.     Azelastine-Fluticasone 137-50 MCG/ACT Susp  Commonly known as:  DYMISTA  Place 1 spray into both nostrils 2 (two) times daily.     cephALEXin 250 MG capsule  Commonly known as:  KEFLEX  take 1 capsule by mouth four times a day for 10 days     cetirizine 10 MG tablet  Commonly known as:  ZYRTEC  Take 10 mg by mouth daily.     clobetasol cream 0.05 %  Commonly known as:  TEMOVATE  Apply 1 application topically as needed.     fluticasone 50 MCG/ACT nasal spray  Commonly known as:  FLONASE  Place 1 spray into both nostrils Daily.  levothyroxine 50 MCG tablet  Commonly known as:  SYNTHROID, LEVOTHROID  Take 50 mcg by mouth daily before breakfast.     meloxicam 15 MG tablet  Commonly known as:  MOBIC  TAKE 1 TABLET BY MOUTH DAILY WITH FOOD FOR 14 DAYS THEN AS NEEDED     methocarbamol 500 MG tablet  Commonly known as:  ROBAXIN  Take 500 mg by mouth 4 (four) times daily. Reported on 12/09/2015      montelukast 10 MG tablet  Commonly known as:  SINGULAIR  Take 10 mg by mouth at bedtime.     oxyCODONE-acetaminophen 5-325 MG tablet  Commonly known as:  PERCOCET/ROXICET  Take by mouth every 4 (four) hours as needed for severe pain. Reported on 12/09/2015     pantoprazole 40 MG tablet  Commonly known as:  PROTONIX  Take 40 mg by mouth as needed.     potassium chloride 10 MEQ tablet  Commonly known as:  K-DUR,KLOR-CON  Take 10 mEq by mouth daily.     PREMARIN 0.625 MG tablet  Generic drug:  estrogens (conjugated)     triamterene-hydrochlorothiazide 75-50 MG tablet  Commonly known as:  MAXZIDE  Take 1 tablet by mouth daily.        Allergies  Allergen Reactions  . Demerol Hives and Rash    All over body  . Epinephrine Palpitations    I appreciate the opportunity to take part in this Delsie's care. Please do not hesitate to contact me with questions.  Sincerely,   R. Jorene Guestarter Kyreese Chio, MD

## 2016-01-19 ENCOUNTER — Encounter (HOSPITAL_COMMUNITY): Payer: Self-pay | Admitting: Nurse Practitioner

## 2016-01-19 ENCOUNTER — Ambulatory Visit (HOSPITAL_COMMUNITY)
Admission: EM | Admit: 2016-01-19 | Discharge: 2016-01-19 | Disposition: A | Discharge status: Home / Self Care | Payer: 59 | Attending: Family Medicine | Admitting: Family Medicine

## 2016-01-19 DIAGNOSIS — S80862A Insect bite (nonvenomous), left lower leg, initial encounter: Secondary | ICD-10-CM

## 2016-01-19 DIAGNOSIS — W57XXXA Bitten or stung by nonvenomous insect and other nonvenomous arthropods, initial encounter: Secondary | ICD-10-CM | POA: Diagnosis not present

## 2016-01-19 MED ORDER — FLUTICASONE PROPIONATE 0.05 % EX CREA: TOPICAL_CREAM | Freq: Two times a day (BID) | CUTANEOUS | Status: DC

## 2016-01-19 NOTE — ED Notes (Addendum)
Pt presents with wound to L anterior calf area. Onset Saturday. She believes she was stung by a bee. C/o increasing redness and swelling since, purulent drainage from the wound. She is concerned for infection. She has taken benadryl with no relief.

## 2016-01-19 NOTE — ED Provider Notes (Signed)
CSN: [id number]     Arrival date & time 01/19/16  1055 History   First MD Initiated Contact with Patient 01/19/16 1208     Chief Complaint  Patient presents with  . Skin Problem   (Consider location/radiation/quality/duration/timing/severity/associated sxs/prior Treatment) Patient is a 60 y.o. female presenting with rash. The history is provided by the patient.  Rash Location:  Leg Leg rash location:  L lower leg Quality: itchiness and redness   Severity:  Mild Onset quality:  Sudden Duration:  3 days Progression:  Unchanged Chronicity:  New Context: insect bite/sting   Relieved by:  None tried Worsened by:  Nothing tried Ineffective treatments:  None tried   Past Medical History  Diagnosis Date  . Arthritis   . Chronic headaches     Sinus  . Groin abscess   . Allergy   . Sleep difficulties   . Thyroid disease     hypothyroidism  . Dysrhythmia     palpitations  . Hypothyroidism   . Anxiety   . GERD (gastroesophageal reflux disease)   . Anemia   . PONV (postoperative nausea and vomiting)   . Hypertension     since age 60   Past Surgical History  Procedure Laterality Date  . Abdominal hysterectomy  1993  . Breast surgery  1993 and 1994    Lt lumpectomy  . Temporomandibular joint surgery  2002  . Incise and drain abcess      abscess left groin  . Tubal ligation    . Inguinal hidradenitis excision  2012    bil  . Shoulder arthroscopy with open rotator cuff repair Left 04/09/2015    Procedure: LEFT SHOULDER ARTHROSCOPY DEBRIDEMENT ,  with open acromioplasty and rotator cuff repair;  Surgeon: Frederico Hammananiel Caffrey, MD;  Location: [facility];  Service: Orthopedics;  Laterality: Left;  . Shoulder acromioplasty Left 04/09/2015    Procedure: SHOULDER ACROMIOPLASTY;  Surgeon: Frederico Hammananiel Caffrey, MD;  Location: [facility];  Service: Orthopedics;  Laterality: Left;   Family History  Problem Relation Age of Onset  . Heart disease Mother   . Diabetes  Mother   . Stroke Mother   . Thyroid disease Mother   . Cancer Father   . Heart disease Father   . Diabetes Father   . Stroke Father   . Thyroid disease Sister    Social History  Substance Use Topics  . Smoking status: Never Smoker   . Smokeless tobacco: None  . Alcohol Use: No   OB History    No data available     Review of Systems  Constitutional: Negative.   Skin: Positive for rash.  All other systems reviewed and are negative.   Allergies  Demerol and Epinephrine  Home Medications   Prior to Admission medications   Medication Sig Start Date End Date Taking? Authorizing Provider  ATENOLOL PO Take 25 mg by mouth daily.     Historical Provider, MD  Azelastine-Fluticasone (DYMISTA) 137-50 MCG/ACT SUSP Place 1 spray into both nostrils 2 (two) times daily. 12/09/15   Cristal Fordalph Carter Bobbitt, MD  cetirizine (ZYRTEC) 10 MG tablet Take 10 mg by mouth daily.      Historical Provider, MD  clobetasol cream (TEMOVATE) 0.05 % Apply 1 application topically as needed.  11/06/15   Historical Provider, MD  fluticasone (FLONASE) 50 MCG/ACT nasal spray Place 1 spray into both nostrils Daily.  01/12/11   Historical Provider, MD  levothyroxine (SYNTHROID, LEVOTHROID) 50 MCG tablet Take 50 mcg by  mouth daily before breakfast.    Historical Provider, MD  meloxicam (MOBIC) 15 MG tablet TAKE 1 TABLET BY MOUTH DAILY WITH FOOD FOR 14 DAYS THEN AS NEEDED 09/10/15   Historical Provider, MD  methocarbamol (ROBAXIN) 500 MG tablet Take 500 mg by mouth 4 (four) times daily. Reported on 12/09/2015    Historical Provider, MD  montelukast (SINGULAIR) 10 MG tablet Take 10 mg by mouth at bedtime.    Historical Provider, MD  oxyCODONE-acetaminophen (PERCOCET/ROXICET) 5-325 MG per tablet Take by mouth every 4 (four) hours as needed for severe pain. Reported on 12/09/2015    Historical Provider, MD  pantoprazole (PROTONIX) 40 MG tablet Take 40 mg by mouth as needed.     Historical Provider, MD  potassium chloride  (K-DUR,KLOR-CON) 10 MEQ tablet Take 10 mEq by mouth daily. 05/12/15   Historical Provider, MD  PREMARIN 0.625 MG tablet  02/21/15   Historical Provider, MD  triamterene-hydrochlorothiazide (MAXZIDE) 75-50 MG per tablet Take 1 tablet by mouth daily.      Historical Provider, MD   Meds Ordered and Administered this Visit  Medications - No data to display  There were no vitals taken for this visit. No data found.   Physical Exam  Constitutional: She is oriented to person, place, and time. She appears well-developed and well-nourished. No distress.  Musculoskeletal: She exhibits tenderness.  Neurological: She is alert and oriented to person, place, and time.  Skin: Skin is warm and dry. Rash noted. There is erythema.  3cm circ erythematous rash with central blister bite site to left lower leg.  Nursing note and vitals reviewed.   ED Course  Procedures (including critical care time)  Labs Review Labs Reviewed - No data to display  Imaging Review No results found.   Visual Acuity Review  Right Eye Distance:   Left Eye Distance:   Bilateral Distance:    Right Eye Near:   Left Eye Near:    Bilateral Near:         MDM   1. Insect bite of leg, left, initial encounter        Linna HoffJames D Oswald Pott, MD 01/19/16 1218

## 2016-10-25 ENCOUNTER — Ambulatory Visit (INDEPENDENT_AMBULATORY_CARE_PROVIDER_SITE_OTHER): Payer: 59 | Admitting: Allergy and Immunology

## 2016-10-25 ENCOUNTER — Encounter: Payer: Self-pay | Admitting: Allergy and Immunology

## 2016-10-25 DIAGNOSIS — J3089 Other allergic rhinitis: Secondary | ICD-10-CM

## 2016-10-25 NOTE — Assessment & Plan Note (Addendum)
   Continue appropriate allergen avoidance measures and Dymista, one spray per nostril twice daily as needed.  Nasal saline spray or rinse as needed and prior to Dymista.  I have recommended switching from cetirizine to fexofenadine as she has used the former on a daily basis for over a year and as a result is likely experiencing reduced efficacy.  At the first symptoms of sinus infection, she may add guaifenesin 1200 mg twice a day with adequate hydration and use oxymetazoline (Afrin) for one or 2 days.  Because of potential for rebound symptoms, oxymetazoline is not to be used for more than 3 days in a row.  If allergen avoidance measures and medications fail to adequately relieve symptoms, we will consider allergy retest in anticipation of aeroallergen immunotherapy.

## 2016-10-25 NOTE — Progress Notes (Signed)
Follow-up Note  RE: Regina Daniels MRN: [id number] DOB: 1955/08/22 Date of Office Visit: 10/25/2016  Primary care provider: Lorenda Peck, MD Referring provider: Burton Apley, MD  History of present illness: Regina Daniels is a 61 y.o. female with allergic rhinitis presenting today for follow up.  She was last seen in this clinic in May 2017.  She reports that she has experienced rhinorrhea, particularly during cold weather.  She takes cetirizine daily, and has done so over the past year.  She also uses Dymista as needed.   Assessment and plan: Allergic rhinitis  Continue appropriate allergen avoidance measures and Dymista, one spray per nostril twice daily as needed.  Nasal saline spray or rinse as needed and prior to Dymista.  I have recommended switching from cetirizine to fexofenadine as she has used the former on a daily basis for over a year and as a result is likely experiencing reduced efficacy.  At the first symptoms of sinus infection, she may add guaifenesin 1200 mg twice a day with adequate hydration and use oxymetazoline (Afrin) for one or 2 days.  Because of potential for rebound symptoms, oxymetazoline is not to be used for more than 3 days in a row.  If allergen avoidance measures and medications fail to adequately relieve symptoms, we will consider allergy retest in anticipation of aeroallergen immunotherapy.   Physical examination: Blood pressure 128/80, pulse 62, resp. rate 16, height  (1.676 m), weight 208 lb (94.3 kg), SpO2 98 %.  General: Alert, interactive, in no acute distress. HEENT: TMs pearly gray, turbinates minimally edematous without discharge, post-pharynx unremarkable. Neck: Supple without lymphadenopathy. Lungs: Clear to auscultation without wheezing, rhonchi or rales. CV: Normal S1, S2 without murmurs. Skin: Warm and dry, without lesions or rashes.  The following portions of the patient's history were reviewed and updated as  appropriate: allergies, current medications, past family history, past medical history, past social history, past surgical history and problem list.  Allergies as of 10/25/2016      Reactions   Demerol Hives, Rash   All over body   Epinephrine Palpitations      Medication List       Accurate as of 10/25/16  7:07 PM. Always use your most recent med list.          ATENOLOL PO Take 25 mg by mouth daily.   Azelastine-Fluticasone 137-50 MCG/ACT Susp Commonly known as:  DYMISTA Place 1 spray into both nostrils 2 (two) times daily.   cetirizine 10 MG tablet Commonly known as:  ZYRTEC Take 10 mg by mouth daily.   clobetasol cream 0.05 % Commonly known as:  TEMOVATE Apply 1 application topically as needed.   levothyroxine 50 MCG tablet Commonly known as:  SYNTHROID, LEVOTHROID Take 50 mcg by mouth daily before breakfast.   meloxicam 15 MG tablet Commonly known as:  MOBIC TAKE 1 TABLET BY MOUTH DAILY WITH FOOD FOR 14 DAYS THEN AS NEEDED   montelukast 10 MG tablet Commonly known as:  SINGULAIR Take 10 mg by mouth at bedtime.   pantoprazole 40 MG tablet Commonly known as:  PROTONIX Take 40 mg by mouth as needed.   potassium chloride 10 MEQ tablet Commonly known as:  K-DUR,KLOR-CON Take 10 mEq by mouth daily.   PREMARIN 0.625 MG tablet Generic drug:  estrogens (conjugated)   triamterene-hydrochlorothiazide 75-50 MG tablet Commonly known as: PATRIECE ARCHBOLDake 1 tablet by mouth daily.       Allergies  Allergen Reactions  .  Demerol Hives and Rash    All over body  . Epinephrine Palpitations    I appreciate the opportunity to take part in Regina Daniels's care. Please do not hesitate to contact me with questions.  Sincerely,   R. Jorene Guest, MD

## 2016-10-25 NOTE — Patient Instructions (Signed)
Allergic rhinitis  Continue appropriate allergen avoidance measures and Dymista, one spray per nostril twice daily as needed.  Nasal saline spray or rinse as needed and prior to Dymista.  I have recommended switching from cetirizine to fexofenadine as she has used the former on a daily basis for over a year and as a result is likely experiencing reduced efficacy.  At the first symptoms of sinus infection, she may add guaifenesin 1200 mg twice a day with adequate hydration and use oxymetazoline (Afrin) for one or 2 days.  Because of potential for rebound symptoms, oxymetazoline is not to be used for more than 3 days in a row.  If allergen avoidance measures and medications fail to adequately relieve symptoms, we will consider allergy retest in anticipation of aeroallergen immunotherapy.   Return in about 1 year (around 10/25/2017), or if symptoms worsen or fail to improve.

## 2017-03-01 ENCOUNTER — Other Ambulatory Visit: Payer: Self-pay | Admitting: Allergy and Immunology

## 2017-03-01 ENCOUNTER — Other Ambulatory Visit: Payer: Self-pay

## 2017-03-01 DIAGNOSIS — J3089 Other allergic rhinitis: Secondary | ICD-10-CM

## 2017-03-01 MED ORDER — AZELASTINE-FLUTICASONE 137-50 MCG/ACT NA SUSP: 1.0000 | Freq: Two times a day (BID) | NASAL | 5 refills | Status: DC

## 2017-03-01 NOTE — Telephone Encounter (Signed)
Patient's pharmacy has closed and [facility]  had all her refill information [facility] [address]. She said [facility] has no record of it. She would like Dymista sent in for [facility] to the [facility] [address]. She said Dr. Nunzio CobbsBobbitt said there was a coupon for that for $14 or so and she wants to know if that is something you send with the prescription or if it is something that she needs to pick up here.

## 2017-03-01 NOTE — Telephone Encounter (Signed)
Called and spoke with patient regarding the Dymista. I have sent in a prescription to her new pharmacy and she will look to see if she still has the coupon she received when she was prescribed it and if she can not find it she will call up here to let us know, if not then she will want to pick one up.

## 2017-03-18 ENCOUNTER — Encounter: Payer: Self-pay | Admitting: Family Medicine

## 2017-03-18 ENCOUNTER — Ambulatory Visit (INDEPENDENT_AMBULATORY_CARE_PROVIDER_SITE_OTHER): Payer: 59 | Admitting: Family Medicine

## 2017-03-18 VITALS — BP 119/78 | HR 58 | Temp 98.7°F | Resp 18 | Ht 66.0 in | Wt 206.2 lb

## 2017-03-18 DIAGNOSIS — J0101 Acute recurrent maxillary sinusitis: Secondary | ICD-10-CM

## 2017-03-18 DIAGNOSIS — J3089 Other allergic rhinitis: Secondary | ICD-10-CM | POA: Diagnosis not present

## 2017-03-18 MED ORDER — AMOXICILLIN-POT CLAVULANATE 875-125 MG PO TABS: 1.0000 | ORAL_TABLET | Freq: Two times a day (BID) | ORAL | 0 refills | Status: DC

## 2017-03-18 NOTE — Progress Notes (Signed)
8/31/20184:24 PM  Regina Daniels 1956/01/29, 61 y.o. female [id number]  Chief Complaint  Patient presents with  . Sinus Problem    x last week       just had a sinus infection 3 weeks ago   . Nasal Congestion  . Cough  . Ear Pain    right ear     HPI:   Patient is a 61 y.o. female with past medical history significant for sinus surgery 3 years ago who presents today for 1 week of nasal congestion, PND, cough, ear pressure. Denies fevers, has had chills. Denies SOB. Endorses sneezing and itching several days ago. Currently using allegra and dynamist plus has added OTC cold and sinus wo benefit. Has not added a decongestant as sudafed makes her shaky. 3 weeks ago seen for worse presentation with fevers, treated with keflex and IM steroid.   Depression screen Regina Daniels 2/9 03/18/2017  Decreased Interest 0  Down, Depressed, Hopeless 0  PHQ - 2 Score 0    Allergies  Allergen Reactions  . Demerol Hives and Rash    All over body  . Epinephrine Palpitations    Current Outpatient Prescriptions on File Prior to Visit  Medication Sig Dispense Refill  . ATENOLOL PO Take 25 mg by mouth daily.     . Azelastine-Fluticasone (DYMISTA) 137-50 MCG/ACT SUSP Place 1 spray into both nostrils 2 (two) times daily. 1 Bottle 5  . cetirizine (ZYRTEC) 10 MG tablet Take 10 mg by mouth daily.      . clobetasol cream (TEMOVATE) 0.05 % Apply 1 application topically as needed.   0  . levothyroxine (SYNTHROID, LEVOTHROID) 50 MCG tablet Take 50 mcg by mouth daily before breakfast.    . meloxicam (MOBIC) 15 MG tablet TAKE 1 TABLET BY MOUTH DAILY WITH FOOD FOR 14 DAYS THEN AS NEEDED  0  . pantoprazole (PROTONIX) 40 MG tablet Take 40 mg by mouth as needed.     . potassium chloride (K-DUR,KLOR-CON) 10 MEQ tablet Take 10 mEq by mouth daily.  0  . PREMARIN 0.625 MG tablet     . triamterene-hydrochlorothiazide (MAXZIDE) 75-50 MG per tablet Take 1 tablet by mouth daily.      . montelukast (SINGULAIR) 10 MG tablet Take  10 mg by mouth at bedtime.     No current facility-administered medications on file prior to visit.     Past Medical History:  Diagnosis Date  . Allergy   . Anemia   . Anxiety   . Arthritis   . Chronic headaches    Sinus  . Dysrhythmia    palpitations  . GERD (gastroesophageal reflux disease)   . Groin abscess   . Hypertension    since age 46  . Hypothyroidism   . PONV (postoperative nausea and vomiting)   . Sleep difficulties   . Thyroid disease    hypothyroidism    Past Surgical History:  Procedure Laterality Date  . ABDOMINAL HYSTERECTOMY  1993  . BREAST SURGERY  1993 and 1994   Lt lumpectomy  . INCISE AND DRAIN ABCESS     abscess left groin  . INGUINAL HIDRADENITIS EXCISION  2012   bil  . SHOULDER ACROMIOPLASTY Left 04/09/2015   Procedure: SHOULDER ACROMIOPLASTY;  Surgeon: Frederico Hamman, MD;  Location: [facility];  Service: Orthopedics;  Laterality: Left;  . SHOULDER ARTHROSCOPY WITH OPEN ROTATOR CUFF REPAIR Left 04/09/2015   Procedure: LEFT SHOULDER ARTHROSCOPY DEBRIDEMENT ,  with open acromioplasty and rotator cuff repair;  Surgeon: Frederico Hammananiel Caffrey, MD;  Location: [facility];  Service: Orthopedics;  Laterality: Left;  . TEMPOROMANDIBULAR JOINT SURGERY  2002  . TUBAL LIGATION      Social History  Substance Use Topics  . Smoking status: Never Smoker  . Smokeless tobacco: Never Used  . Alcohol use No    Family History  Problem Relation Age of Onset  . Heart disease Mother   . Diabetes Mother   . Stroke Mother   . Thyroid disease Mother   . Cancer Father   . Heart disease Father   . Diabetes Father   . Stroke Father   . Thyroid disease Sister     Review of Systems  Constitutional: Positive for chills. Negative for fever.  HENT: Positive for congestion and sinus pain. Negative for ear pain and sore throat.   Respiratory: Positive for cough. Negative for shortness of breath.   Cardiovascular: Negative for chest pain,  palpitations and leg swelling.  Gastrointestinal: Negative for abdominal pain, nausea and vomiting.     OBJECTIVE:  Blood pressure 119/78, pulse (!) 58, temperature 98.7 F (37.1 C), temperature source Oral, resp. rate 18, height 5\' 6"  (1.676 m), weight 206 lb 3.2 oz (93.5 kg), SpO2 99 %.  Physical Exam  Constitutional: She is oriented to person, place, and time and well-developed, well-nourished, and in no distress.  HENT:  Head: Normocephalic and atraumatic.  Right Ear: Hearing, tympanic membrane, external ear and ear canal normal.  Left Ear: Hearing, tympanic membrane, external ear and ear canal normal.  Mouth/Throat: Oropharynx is clear and moist.  Nares edematous, clear drainage OP with enlarged tonsils and clear PND Mild TTP left frontal and maxillary sinus  Eyes: Pupils are equal, round, and reactive to light. EOM are normal.  Neck: Neck supple.  Cardiovascular: Normal rate, regular rhythm and normal heart sounds.  Exam reveals no gallop and no friction rub.   No murmur heard. Pulmonary/Chest: Effort normal and breath sounds normal. She has no wheezes. She has no rales.  Lymphadenopathy:    She has no cervical adenopathy.  Neurological: She is alert and oriented to person, place, and time. Gait normal.  Skin: Skin is warm and dry.     ASSESSMENT and PLAN:  1. Acute recurrent maxillary sinusitis Discussed delayed abx given no fevers, rx for augmenting 10 days sent.  Encouraged OTC oral decongestant such phenylephrine and use of nasal saline spray.   2. Allergic rhinitis Encouraged patient discuss possible referral to ENT with her PCP given uncontrolled allergies and returning of frequent sinusitis       Myles LippsIrma M Santiago, MD Primary Care at [facility] [place] [address] [place], [place] [id number] Ph.  [phone] Fax [phone]

## 2017-03-18 NOTE — Patient Instructions (Addendum)
IF you received an x-ray today, you will receive an invoice from [facility]. Please contact [place] Sinusitis, Adult Sinusitis is soreness and inflammation of your sinuses. Sinuses are hollow spaces in the bones around your face. Your sinuses are located:  Around your eyes.  In the middle of your forehead.  Behind your nose.  In your cheekbones.  Your sinuses and nasal passages are lined with a stringy fluid (mucus). Mucus normally drains out of your sinuses. When your nasal tissues become inflamed or swollen, the mucus can become trapped or blocked so air cannot flow through your sinuses. This allows bacteria, viruses, and funguses to grow, which leads to infection. Sinusitis can develop quickly and last for 7?10 days (acute) or for more than 12 weeks (chronic). Sinusitis often develops after a cold. What are the causes? This condition is caused by anything that creates swelling in the sinuses or stops mucus from draining, including:  Allergies.  Asthma.  Bacterial or viral infection.  Abnormally shaped bones between the nasal passages.  Nasal growths that contain mucus (nasal polyps).  Narrow sinus openings.  Pollutants, such as chemicals or irritants in the air.  A foreign object stuck in the nose.  A fungal infection. This is rare.  What increases the risk? The following factors may make you more likely to develop this condition:  Having allergies or asthma.  Having had a recent cold or respiratory tract infection.  Having structural deformities or blockages in your nose or sinuses.  Having a weak immune system.  Doing a lot of swimming or diving.  Overusing nasal sprays.  Smoking.  What are the signs or symptoms? The main symptoms of this condition are pain and a feeling of pressure around the affected sinuses. Other symptoms include:  Upper toothache.  Earache.  Headache.  Bad breath.  Decreased sense of smell and taste.  A  cough that may get worse at night.  Fatigue.  Fever.  Thick drainage from your nose. The drainage is often green and it may contain pus (purulent).  Stuffy nose or congestion.  Postnasal drip. This is when extra mucus collects in the throat or back of the nose.  Swelling and warmth over the affected sinuses.  Sore throat.  Sensitivity to light.  How is this diagnosed? This condition is diagnosed based on symptoms, a medical history, and a physical exam. To find out if your condition is acute or chronic, your health care provider may:  Look in your nose for signs of nasal polyps.  Tap over the affected sinus to check for signs of infection.  View the inside of your sinuses using an imaging device that has a light attached (endoscope).  If your health care provider suspects that you have chronic sinusitis, you may also:  Be tested for allergies.  Have a sample of mucus taken from your nose (nasal culture) and checked for bacteria.  Have a mucus sample examined to see if your sinusitis is related to an allergy.  If your sinusitis does not respond to treatment and it lasts longer than 8 weeks, you may have an MRI or CT scan to check your sinuses. These scans also help to determine how severe your infection is. In rare cases, a bone biopsy may be done to rule out more serious types of fungal sinus disease. How is this treated? Treatment for sinusitis depends on the cause and whether your condition is chronic or acute. If a virus is causing your sinusitis,  your symptoms will go away on their own within 10 days. You may be given medicines to relieve your symptoms, including:  Topical nasal decongestants. They shrink swollen nasal passages and let mucus drain from your sinuses.  Antihistamines. These drugs block inflammation that is triggered by allergies. This can help to ease swelling in your nose and sinuses.  Topical nasal corticosteroids. These are nasal sprays that ease  inflammation and swelling in your nose and sinuses.  Nasal saline washes. These rinses can help to get rid of thick mucus in your nose.  If your condition is caused by bacteria, you will be given an antibiotic medicine. If your condition is caused by a fungus, you will be given an antifungal medicine. Surgery may be needed to correct underlying conditions, such as narrow nasal passages. Surgery may also be needed to remove polyps. Follow these instructions at home: Medicines  Take, use, or apply over-the-counter and prescription medicines only as told by your health care provider. These may include nasal sprays.  If you were prescribed an antibiotic medicine, take it as told by your health care provider. Do not stop taking the antibiotic even if you start to feel better. Hydrate and Humidify  Drink enough water to keep your urine clear or pale yellow. Staying hydrated will help to thin your mucus.  Use a cool mist humidifier to keep the humidity level in your home above 50%.  Inhale steam for 10-15 minutes, 3-4 times a day or as told by your health care provider. You can do this in the bathroom while a hot shower is running.  Limit your exposure to cool or dry air. Rest  Rest as much as possible.  Sleep with your head raised (elevated).  Make sure to get enough sleep each night. General instructions  Apply a warm, moist washcloth to your face 3-4 times a day or as told by your health care provider. This will help with discomfort.  Wash your hands often with soap and water to reduce your exposure to viruses and other germs. If soap and water are not available, use hand sanitizer.  Do not smoke. Avoid being around people who are smoking (secondhand smoke).  Keep all follow-up visits as told by your health care provider. This is important. Contact a health care provider if:  You have a fever.  Your symptoms get worse.  Your symptoms do not improve within 10 days. Get help  right away if:  You have a severe headache.  You have persistent vomiting.  You have pain or swelling around your face or eyes.  You have vision problems.  You develop confusion.  Your neck is stiff.  You have trouble breathing. This information is not intended to replace advice given to you by your health care provider. Make sure you discuss any questions you have with your health care provider. Document Released: 07/05/2005 Document Revised: 02/29/2016 Document Reviewed: 04/30/2015 Elsevier Interactive Patient Education  2017 [employer].  Radiology at [phone] with questions or concerns regarding your invoice.   IF you received labwork today, you will receive an invoice from [facility]. Please contact [facility] at [phone] with questions or concerns regarding your invoice.   Our billing staff will not be able to assist you with questions regarding bills from these companies.  You will be contacted with the lab results as soon as they are available. The fastest way to get your results is to activate your My Chart account. Instructions are located on the last page  of this paperwork. If you have not heard from [place] regarding the results in 2 weeks, please contact this office.

## 2017-04-04 ENCOUNTER — Ambulatory Visit (INDEPENDENT_AMBULATORY_CARE_PROVIDER_SITE_OTHER): Payer: 59

## 2017-04-04 ENCOUNTER — Ambulatory Visit (INDEPENDENT_AMBULATORY_CARE_PROVIDER_SITE_OTHER): Payer: 59 | Admitting: Podiatry

## 2017-04-04 ENCOUNTER — Encounter: Payer: Self-pay | Admitting: Podiatry

## 2017-04-04 VITALS — Resp 16 | Ht 66.0 in | Wt 200.0 lb

## 2017-04-04 DIAGNOSIS — M2041 Other hammer toe(s) (acquired), right foot: Secondary | ICD-10-CM

## 2017-04-04 DIAGNOSIS — M21619 Bunion of unspecified foot: Secondary | ICD-10-CM | POA: Diagnosis not present

## 2017-04-04 DIAGNOSIS — M21611 Bunion of right foot: Secondary | ICD-10-CM

## 2017-04-04 DIAGNOSIS — M79674 Pain in right toe(s): Secondary | ICD-10-CM

## 2017-04-04 NOTE — Progress Notes (Signed)
   Subjective:    Patient ID: Regina Daniels, female    DOB: Aug 07, 1955, 61 y.o.   MRN: [id number]  HPI    Review of Systems  HENT: Positive for sinus pain.   Respiratory: Positive for cough.   Endocrine: Positive for cold intolerance and heat intolerance.  Musculoskeletal: Positive for arthralgias and back pain.  All other systems reviewed and are negative.      Objective:   Physical Exam        Assessment & Plan:

## 2017-04-06 NOTE — Progress Notes (Signed)
   Subjective: Patient is a 61 year old female presenting today as a new patient with a complaint of sharp, shooting, aching pain to the right great toe that began about 2 years ago. She also reports corns to bilateral fifth toes. Standing increases the pain. She has not done anything to treat the symptoms. She is here for further evaluation and treatment.   Past Medical History:  Diagnosis Date  . Allergy   . Anemia   . Anxiety   . Arthritis   . Chronic headaches    Sinus  . Dysrhythmia    palpitations  . GERD (gastroesophageal reflux disease)   . Groin abscess   . Hypertension    since age 55  . Hypothyroidism   . PONV (postoperative nausea and vomiting)   . Sleep difficulties   . Thyroid disease    hypothyroidism    Objective: Physical Exam General: The patient is alert and oriented x3 in no acute distress.  Dermatology: Skin is cool, dry and supple bilateral lower extremities. Negative for open lesions or macerations.  Vascular: Palpable pedal pulses bilaterally. No edema or erythema noted. Capillary refill within normal limits.  Neurological: Epicritic and protective threshold grossly intact bilaterally.   Musculoskeletal Exam: Clinical evidence of bunion deformity noted to the respective foot. There is a moderate pain on palpation range of motion of the first MPJ. Lateral deviation of the hallux noted consistent with hallux abductovalgus. Hammertoe contracture also noted on clinical exam to 5th digit of the right foot. Symptomatic pain on palpation and range of motion also noted to the metatarsal phalangeal joints of the respective hammertoe digits.    Radiographic Exam: Increased intermetatarsal angle greater than 15 with a hallux abductus angle greater than 30 noted on AP view. Moderate degenerative changes noted within the first MPJ. Contracture deformity also noted to the interphalangeal joints and MPJs of the digits of the respective  hammertoes.    Assessment: 1. 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 Hammertoe deformity right fifth toe   Plan of Care:  1. Patient was evaluated. X-rays reviewed. 2. Discussed surgical treatment versus conservative treatment. 3. Patient wants surgery, but wants to wait until spring of next year. 4. Return to clinic when ready for surgery.   Felecia Shelling, DPM [facility]  Dr. Felecia Shelling, DPM    [address]                                        [place], [place] [address]                Office [phone]  Fax [phone]

## 2017-04-26 ENCOUNTER — Other Ambulatory Visit: Payer: Self-pay | Admitting: Podiatry

## 2017-04-26 DIAGNOSIS — M21619 Bunion of unspecified foot: Secondary | ICD-10-CM

## 2017-05-24 ENCOUNTER — Other Ambulatory Visit: Payer: Self-pay | Admitting: Pharmacist

## 2017-05-24 MED ORDER — ZOSTER VAC RECOMB ADJUVANTED 50 MCG/0.5ML IM SUSR: 0.5000 mL | Freq: Once | INTRAMUSCULAR | 1 refills | Status: AC

## 2017-05-27 MED FILL — SHINGRIX VIAL KIT: 50 | 1 days supply | Qty: 1 | Fill #0

## 2017-06-25 ENCOUNTER — Ambulatory Visit: Payer: 59 | Admitting: Physician Assistant

## 2017-07-01 DIAGNOSIS — R05 Cough: Secondary | ICD-10-CM | POA: Insufficient documentation

## 2017-07-01 DIAGNOSIS — R053 Chronic cough: Secondary | ICD-10-CM | POA: Insufficient documentation

## 2017-07-29 MED FILL — SHINGRIX VIAL KIT: 50 | 1 days supply | Qty: 1 | Fill #1

## 2017-10-26 ENCOUNTER — Encounter: Payer: Self-pay | Admitting: Physician Assistant

## 2018-06-12 ENCOUNTER — Encounter: Payer: Self-pay | Admitting: Podiatry

## 2018-06-12 ENCOUNTER — Ambulatory Visit: Payer: 59 | Admitting: Podiatry

## 2018-06-12 ENCOUNTER — Ambulatory Visit (INDEPENDENT_AMBULATORY_CARE_PROVIDER_SITE_OTHER): Payer: 59

## 2018-06-12 DIAGNOSIS — M2041 Other hammer toe(s) (acquired), right foot: Secondary | ICD-10-CM

## 2018-06-12 DIAGNOSIS — M21611 Bunion of right foot: Secondary | ICD-10-CM | POA: Diagnosis not present

## 2018-06-12 DIAGNOSIS — M7751 Other enthesopathy of right foot: Secondary | ICD-10-CM

## 2018-06-13 ENCOUNTER — Telehealth: Payer: Self-pay | Admitting: Podiatry

## 2018-06-13 NOTE — Telephone Encounter (Signed)
Pt was seen yesterday and received an injection in foot. Callign to verify if prednisone was in the shot because she forgot to put on her paperwork that she cannot take prednisone as it gives her bad headaches and keeps her up at night. Pt just wants to verify and make sure it is noted in her records going forward. Please give patient a call

## 2018-06-13 NOTE — Telephone Encounter (Signed)
I called pt and she states she forgot to tell staff she gets terrible headaches from prednisone, because she had such a terrible day yesterday, she got up and felt bad and had to go to the doctor and when she went out to go to the doctor her tire was flat. I told pt that we do not give prednisone here, but the clinicals were not available at this time. I told pt that she was probably given either kenalog or celestone, both are steroids like prednisone. Pt states she has a headache and has not slept since Sunday.

## 2018-06-13 NOTE — Progress Notes (Signed)
   Subjective: 62 year old Daniels presenting today for follow up evaluation of a bunion and fifth digit hammertoe of the right foot. She also reports pain in the 5th toe of the same foot. She states it feels as if she is walking on a pebble. She has been taking Meloxicam for treatment. Patient is here for further evaluation and treatment.    Past Medical History:  Diagnosis Date  . Allergy   . Anemia   . Anxiety   . Arthritis   . Chronic headaches    Sinus  . Dysrhythmia    palpitations  . GERD (gastroesophageal reflux disease)   . Groin abscess   . Hypertension    since age 62  . Hypothyroidism   . PONV (postoperative nausea and vomiting)   . Sleep difficulties   . Thyroid disease    hypothyroidism    Objective: Physical Exam General: The patient is alert and oriented x3 in no acute distress.  Dermatology: Skin is cool, dry and supple bilateral lower extremities. Negative for open lesions or macerations.  Vascular: Palpable pedal pulses bilaterally. No edema or erythema noted. Capillary refill within normal limits.  Neurological: Epicritic and protective threshold grossly intact bilaterally.   Musculoskeletal Exam: Clinical evidence of bunion deformity noted to the respective foot. There is moderate pain on palpation range of motion of the first MPJ as well as the 5th MPJ right. Lateral deviation of the hallux noted consistent with hallux abductovalgus. Hammertoe contracture also noted on clinical exam to 5th digit of the right foot. Symptomatic pain on palpation and range of motion also noted to the metatarsal phalangeal joints of the respective hammertoe digits.    Radiographic Exam: Increased intermetatarsal angle greater than 15 with a hallux abductus angle greater than 30 noted on AP view. Moderate degenerative changes noted within the first MPJ. Contracture deformity also noted to the interphalangeal joints and MPJs of the digits of the respective  hammertoes.    Assessment: 1. HAV w/ bunion deformity right  2. Hammertoe deformity right fifth toe 3. 5th MPJ capsulitis right    Plan of Care:  1. Patient was evaluated. X-rays reviewed. 2. Injection of 0.5 mLs Celestone Soluspan injected into the 5th MPJ of the right foot.  3. Continue taking Meloxicam daily.  4. Recommended good shoe gear.  5. Return to clinic in 3 months for surgical consult.   Works for [employer] in [employer]. Retiring 03/2019.    Felecia ShellingBrent M. Shar Paez, DPM [facility]  Dr. Felecia ShellingBrent M. Caoimhe Damron, DPM    [address]                                        [place], [place] [address]                Office [phone]  Fax [phone]

## 2018-07-28 ENCOUNTER — Telehealth: Payer: Self-pay | Admitting: *Deleted

## 2018-07-28 NOTE — Telephone Encounter (Signed)
"  I need to know if Dr. Logan Bores is already scheduling surgery for April.  I need to make an appointment to come back and see him when he'll be scheduling surgery for April.  I don't know if I can schedule now in January or if I need to wait until February.  Please give me a call."

## 2018-08-02 NOTE — Telephone Encounter (Signed)
I left her a message that Dr. Logan Bores can do surgery on any Thursday in April but that she will need to schedule an appointment for a consultation.  We will give her a date for surgery once she sees Dr. Logan Bores for a consult.

## 2018-08-07 ENCOUNTER — Ambulatory Visit: Payer: 59 | Admitting: Podiatry

## 2018-08-07 DIAGNOSIS — N390 Urinary tract infection, site not specified: Secondary | ICD-10-CM | POA: Insufficient documentation

## 2018-08-07 DIAGNOSIS — M2041 Other hammer toe(s) (acquired), right foot: Secondary | ICD-10-CM

## 2018-08-07 DIAGNOSIS — M21611 Bunion of right foot: Secondary | ICD-10-CM | POA: Diagnosis not present

## 2018-08-07 DIAGNOSIS — R300 Dysuria: Secondary | ICD-10-CM | POA: Insufficient documentation

## 2018-08-07 DIAGNOSIS — J0140 Acute pansinusitis, unspecified: Secondary | ICD-10-CM | POA: Insufficient documentation

## 2018-08-07 DIAGNOSIS — N920 Excessive and frequent menstruation with regular cycle: Secondary | ICD-10-CM | POA: Insufficient documentation

## 2018-08-14 NOTE — Progress Notes (Signed)
   Subjective: 63 year old female presenting today for follow up evaluation of right foot pain. She states the injection she received at the last visit helped alleviate some of her symptoms but reports continue pain on the medial aspect of the foot. She is interested in surgery at this point. Patient is here for further evaluation and treatment.    Past Medical History:  Diagnosis Date  . Allergy   . Anemia   . Anxiety   . Arthritis   . Chronic headaches    Sinus  . Dysrhythmia    palpitations  . GERD (gastroesophageal reflux disease)   . Groin abscess   . Hypertension    since age 71  . Hypothyroidism   . PONV (postoperative nausea and vomiting)   . Sleep difficulties   . Thyroid disease    hypothyroidism    Objective: Physical Exam General: The patient is alert and oriented x3 in no acute distress.  Dermatology: Skin is cool, dry and supple bilateral lower extremities. Negative for open lesions or macerations.  Vascular: Palpable pedal pulses bilaterally. No edema or erythema noted. Capillary refill within normal limits.  Neurological: Epicritic and protective threshold grossly intact bilaterally.   Musculoskeletal Exam: Clinical evidence of bunion deformity noted to the respective foot. There is moderate pain on palpation range of motion of the first MPJ of the right foot. Lateral deviation of the hallux noted consistent with hallux abductovalgus. Hammertoe contracture also noted on clinical exam to 5th digit of the right foot. Symptomatic pain on palpation and range of motion also noted to the metatarsal phalangeal joints of the respective hammertoe digits.   Clinical evidence of Tailor's bunion deformity noted to the respective foot. There is a moderate pain on palpation range of motion of the fifth MPJ.    Assessment: 1. HAV w/ bunion deformity right  2. Hammertoe deformity right fifth toe 3. Tailor's bunion right     Plan of Care:  1. Patient was evaluated.  2.  Today we discussed the conservative versus surgical management of the presenting pathology. The patient opts for surgical management. All possible complications and details of the procedure were explained. All patient questions were answered. No guarantees were expressed or implied. 3. Authorization for surgery was initiated today. Surgery will consist of bunionectomy with metatarsal osteotomy right; Tailor's bunionectomy right; hammertoe repair 5th digit right foot.  4. CAM boot dispensed.  5. Return to clinic one week post op.   Works for [employer] in [employer] Retiring 03/2019. Beach trip in May.    Felecia Shelling, DPM [facility]  Dr. Felecia Shelling, DPM    [address]                                        [place], [place] [address]                Office [phone]  Fax [phone]

## 2018-08-23 ENCOUNTER — Telehealth: Payer: Self-pay | Admitting: Podiatry

## 2018-08-23 NOTE — Telephone Encounter (Signed)
I'm calling to see if my insurance has been authorized for my surgery scheduled on  12 March with Dr. Logan Bores. Please return my call.

## 2018-08-25 ENCOUNTER — Encounter: Payer: Self-pay | Admitting: Allergy

## 2018-08-25 ENCOUNTER — Ambulatory Visit: Payer: 59 | Admitting: Allergy

## 2018-08-25 VITALS — BP 110/60 | HR 63 | Temp 98.3°F | Resp 18 | Ht 65.0 in | Wt 204.8 lb

## 2018-08-25 DIAGNOSIS — J3089 Other allergic rhinitis: Secondary | ICD-10-CM

## 2018-08-25 MED ORDER — IPRATROPIUM BROMIDE 0.06 % NA SOLN: 2.0000 | Freq: Three times a day (TID) | NASAL | 5 refills | Status: DC

## 2018-08-25 NOTE — Patient Instructions (Addendum)
Allergic rhinitis with recurrent sinusitis  Continue allergen avoidance measures   Flonase,1-2 sprays per nostril twice daily as needed for nasal congestion.  Use for 1-2 weeks at a time before stopping  Will have you use nasal Atrovent 0.06% 2 sprays each nostril up to 3-4 times a day as needed for runny nose/post-nasal drip  Nasal saline spray or rinse (rinse kit provided today -- boil water and bring to room temp or use room temp distilled water) as needed and prior to Flonase use.  Continue Zyrtec 63m daily  Can add Mucinex DM 1200 mg twice a day with adequate hydration   If allergen avoidance measures and medications fail to relieve symptoms consider allergy retest and course of immunotherapy (allergy shots)  If continues to have sinusitis will recommend CT sinus scan  Return in about 3-4 months or sooner if needed

## 2018-08-25 NOTE — Progress Notes (Signed)
Follow-up Note  RE: Regina Daniels MRN: [id number] DOB: 1956-02-16 Date of Office Visit: 08/25/2018   History of present illness: Regina Daniels is a 63 y.o. female presenting today for sick visit.  She was last seen in the office on October 25, 2016 by Dr. Verlin Fester.  She reports she has been having runny nose, cough, HA, ear pain and recurrent sinus infections.  She states she had a sinus infection in Dec which turned into bronchitis.  She states prior to Dec she had an infection in late winter 2019 and at the end of summer 2019.  She reports treatment of sinus infection with antibiotics and steroids.  She recalls being treated with keflex on several occasions.    She states she is currently taking flonase 1 spray each nostril daily, zyrtec and also taking sudafed.    She used to take dymista but was making her cough worse she felt and didn't see any benefit over the flonase thus she stopped.    She has seen ENT (Dr. Wilburn Cornelia) for chronic rhinitis with last visit in 08/2017.  She has history of nasal septoplasty and inf turbinate reduction in 2015.  Review of systems: Review of Systems  Constitutional: Negative for chills, fever and malaise/fatigue.  HENT: Positive for congestion, ear pain and sinus pain. Negative for ear discharge, hearing loss, nosebleeds, sore throat and tinnitus.   Eyes: Negative for pain, discharge and redness.  Respiratory: Positive for cough. Negative for sputum production, shortness of breath and wheezing.   Cardiovascular: Negative for chest pain.  Gastrointestinal: Negative for abdominal pain, constipation, diarrhea, heartburn, nausea and vomiting.  Musculoskeletal: Negative for falls, joint pain and myalgias.  Skin: Negative for itching and rash.  Neurological: Negative for headaches.    All other systems negative unless noted above in HPI  Past medical/social/surgical/family history have been reviewed and are unchanged unless specifically indicated  below.  No changes  Medication List: Allergies as of 08/25/2018      Reactions   Other    Steroid injections-headaches   Prednisone Other (See Comments)   headache   Sudafed [pseudoephedrine Hcl]    Pt stated, "Makes me feel wired and keeps me up all night"   Demerol Hives, Rash   All over body   Epinephrine Palpitations      Medication List       Accurate as of August 25, 2018  4:04 PM. Always use your most recent med list.        ATENOLOL PO Take 25 mg by mouth daily.   cephALEXin 500 MG capsule Commonly known as:  KEFLEX TK ONE C PO QID   cetirizine 10 MG tablet Commonly known as:  ZYRTEC Take 10 mg by mouth daily.   clobetasol cream 0.05 % Commonly known as:  TEMOVATE Apply 1 application topically as needed.   fluticasone 50 MCG/ACT nasal spray Commonly known as:  FLONASE Place into the nose.   ipratropium 0.06 % nasal spray Commonly known as:  ATROVENT Place 2 sprays into both nostrils 3 (three) times daily.   levothyroxine 50 MCG tablet Commonly known as:  SYNTHROID, LEVOTHROID Take 50 mcg by mouth daily before breakfast.   meloxicam 15 MG tablet Commonly known as:  MOBIC TAKE 1 TABLET BY MOUTH DAILY WITH FOOD FOR 14 DAYS THEN AS NEEDED   pantoprazole 40 MG tablet Commonly known as:  PROTONIX Take 40 mg by mouth as needed.   potassium chloride 10 MEQ tablet Commonly known as:  K-DUR,KLOR-CON Take 10 mEq by mouth daily.   PREMARIN 0.625 MG tablet Generic drug:  estrogens (conjugated)   simvastatin 20 MG tablet Commonly known as:  ZOCOR   triamterene-hydrochlorothiazide 75-50 MG tablet Commonly known as:  MAXZIDE Take 1 tablet by mouth daily.       Known medication allergies: Allergies  Allergen Reactions  . Other     Steroid injections-headaches  . Prednisone Other (See Comments)    headache  . Sudafed [Pseudoephedrine Hcl]     Pt stated, "Makes me feel wired and keeps me up all night"  . Demerol Hives and Rash    All over  body  . Epinephrine Palpitations     Physical examination: Blood pressure 110/60, pulse 63, temperature 98.3 F (36.8 C), temperature source Oral, resp. rate 18, height _0  (1.651 m), weight 204 lb 12.8 oz (92.9 kg), SpO2 98 %.  General: Alert, interactive, in no acute distress. HEENT: PERRLA, TMs pearly gray, turbinates moderately edematous with clear discharge, post-pharynx non erythematous, mild cobblestoning to posterior oropharynx Neck: Supple without lymphadenopathy. Lungs: Clear to auscultation without wheezing, rhonchi or rales. {no increased work of breathing. CV: Normal S1, S2 without murmurs. Abdomen: Nondistended, nontender. Skin: Warm and dry, without lesions or rashes. Extremities:  No clubbing, cyanosis or edema. Neuro:   Grossly intact.  Diagnositics/Labs: None today  Assessment and plan:   Allergic rhinitis with recurrent sinusitis  Continue allergen avoidance measures   Flonase,1-2 sprays per nostril twice daily as needed for nasal congestion.  Use for 1-2 weeks at a time before stopping  Will have you use nasal Atrovent 0.06% 2 sprays each nostril up to 3-4 times a day as needed for runny nose/post-nasal drip  Nasal saline spray or rinse (rinse kit provided today -- boil water and bring to room temp or use room temp distilled water) as needed and prior to Flonase use.  Continue Zyrtec 79m daily  Can add Mucinex DM 1200 mg twice a day with adequate hydration   If allergen avoidance measures and medications fail to relieve symptoms consider allergy retest and course of immunotherapy (allergy shots)  If continues to have sinusitis will recommend CT sinus scan  Return in about 3-4 months or sooner if needed   I appreciate the opportunity to take part in Morgann's care. Please do not hesitate to contact me with questions.  Sincerely,   SPrudy Feeler MD Allergy/Immunology [facility] [place]

## 2018-08-30 ENCOUNTER — Telehealth: Payer: Self-pay | Admitting: Allergy

## 2018-08-30 MED ORDER — AMOXICILLIN-POT CLAVULANATE 875-125 MG PO TABS: 1.0000 | ORAL_TABLET | Freq: Two times a day (BID) | ORAL | 0 refills | Status: AC

## 2018-08-30 NOTE — Telephone Encounter (Signed)
Patient saw Dr. Delorse Lek 08-25-18 and she said she is not feeling any better. She is very congested and has green discharge out of her nose.

## 2018-08-30 NOTE — Telephone Encounter (Signed)
Spoke with patient and advise Rx Augmentin was sent into [facility].

## 2018-08-30 NOTE — Telephone Encounter (Signed)
Ok if she is still symptomatic with yucky drainage with sinus pressure/pain despite recommended therapy then will have you do a course of Augmentin.   It does not appear to be on her allergy list (please ensure she does not have PCN allergy).    Augmentin 875mg  1 tab twice a day x 10 days.     She should continue current medications with antibiotic.

## 2018-08-30 NOTE — Telephone Encounter (Signed)
Called patient and she stated that she is still having sinus pressure and drainage down the back of her throat. She states that her cough has improved some but she is still having a lot of post nasal drainage and the phlegm she manages to cough up is green. She states that she is using all of the medication as prescribed. Please advise.

## 2018-09-07 ENCOUNTER — Telehealth: Payer: Self-pay | Admitting: *Deleted

## 2018-09-08 ENCOUNTER — Telehealth: Payer: Self-pay | Admitting: *Deleted

## 2018-09-08 NOTE — Telephone Encounter (Signed)
Left message informing pt I would send her request to the surgical coordinator.

## 2018-09-08 NOTE — Telephone Encounter (Signed)
Pt states she is calling to check the status of her 09/17/2018 surgery prior authorization.

## 2018-09-13 NOTE — Telephone Encounter (Signed)
"  I'm calling to see if my surgery has been authorized through [employer] for my surgery on March 12."

## 2018-09-13 NOTE — Telephone Encounter (Signed)
I haven't gotten that far yet.

## 2018-09-14 NOTE — Telephone Encounter (Signed)
Pt is aware I'm working on it and will give her a call by tomorrow with the information.

## 2018-09-15 ENCOUNTER — Ambulatory Visit: Payer: 59 | Admitting: Allergy

## 2018-09-15 ENCOUNTER — Encounter: Payer: Self-pay | Admitting: Allergy

## 2018-09-15 VITALS — BP 126/82 | HR 58 | Resp 12

## 2018-09-15 DIAGNOSIS — J3089 Other allergic rhinitis: Secondary | ICD-10-CM | POA: Diagnosis not present

## 2018-09-15 MED ORDER — OLOPATADINE HCL 0.6 % NA SOLN: 2.0000 | Freq: Two times a day (BID) | NASAL | 3 refills | Status: DC

## 2018-09-15 NOTE — Progress Notes (Signed)
Follow-up Note  RE: Regina Daniels MRN: [id number] DOB: [birth year] Date of Office Visit: 09/15/2018   History of present illness: Regina Daniels is a 63 y.o. female presenting today for follow-up of recent sinusitis.  She was last seen in the office on August 25, 2018 by myself.  At this visit I recommended that she continue her routine medications of Flonase and Zyrtec as well as adding in Mucinex to this regimen.  I also recommended that she use nasal Atrovent for control of rhinorrhea.  She did call the office with continued symptoms thus I had her complete an Augmentin course.  She states that her symptoms did get better and she is feeling better.  However she does still have some degree of rhinorrhea.  Her biggest concern today is to ensure that she is okay to have a foot surgery under general anesthesia per the patient set for March 12.  She has not been having any respiratory symptoms.  And her sinus symptoms have improved since her last visit.    Review of systems: Review of Systems  Constitutional: Negative for chills, fever and malaise/fatigue.  HENT: Positive for congestion and sinus pain. Negative for ear discharge and nosebleeds.   Eyes: Negative for pain, discharge and redness.  Respiratory: Negative for cough, shortness of breath and wheezing.   Cardiovascular: Negative for chest pain.  Gastrointestinal: Negative for abdominal pain, constipation, diarrhea, heartburn, nausea and vomiting.  Musculoskeletal: Negative for joint pain.  Skin: Negative for itching and rash.  Neurological: Negative for headaches.    All other systems negative unless noted above in HPI  Past medical/social/surgical/family history have been reviewed and are unchanged unless specifically indicated below.  No changes  Medication List: Allergies as of 09/15/2018      Reactions   Other    Steroid injections-headaches   Prednisone Other (See Comments)   headache   Sudafed [pseudoephedrine Hcl]     Pt stated, "Makes me feel wired and keeps me up all night"   Demerol Hives, Rash   All over body   Epinephrine Palpitations      Medication List       Accurate as of September 15, 2018  5:06 PM. Always use your most recent med list.        ATENOLOL PO Take 25 mg by mouth daily.   cephALEXin 500 MG capsule Commonly known as:  KEFLEX TK ONE C PO QID   cetirizine 10 MG tablet Commonly known as:  ZYRTEC Take 10 mg by mouth daily.   clobetasol cream 0.05 % Commonly known as:  TEMOVATE Apply 1 application topically as needed.   estradiol 1 MG tablet Commonly known as:  ESTRACE TK 1 T PO QD   fluticasone 50 MCG/ACT nasal spray Commonly known as:  FLONASE Place into the nose.   ipratropium 0.06 % nasal spray Commonly known as:  ATROVENT Place 2 sprays into both nostrils 3 (three) times daily.   levothyroxine 50 MCG tablet Commonly known as:  SYNTHROID, LEVOTHROID Take 50 mcg by mouth daily before breakfast.   meloxicam 15 MG tablet Commonly known as:  MOBIC TAKE 1 TABLET BY MOUTH DAILY WITH FOOD FOR 14 DAYS THEN AS NEEDED   Olopatadine HCl 0.6 % Soln Commonly known as:  PATANASE Place 2 sprays into the nose 2 (two) times daily.   pantoprazole 40 MG tablet Commonly known as:  PROTONIX Take 40 mg by mouth as needed.   potassium chloride 10 MEQ tablet  Commonly known as:  K-DUR,KLOR-CON Take 10 mEq by mouth daily.   potassium chloride 10 MEQ tablet Commonly known as:  K-DUR TK 1 T PO QD FOR POTASSIUM   PREMARIN 0.625 MG tablet Generic drug:  estrogens (conjugated)   simvastatin 20 MG tablet Commonly known as:  ZOCOR   triamterene-hydrochlorothiazide 75-50 MG tablet Commonly known as:  MAXZIDE Take 1 tablet by mouth daily.       Known medication allergies: Allergies  Allergen Reactions  . Other     Steroid injections-headaches  . Prednisone Other (See Comments)    headache  . Sudafed [Pseudoephedrine Hcl]     Pt stated, "Makes me feel wired  and keeps me up all night"  . Demerol Hives and Rash    All over body  . Epinephrine Palpitations     Physical examination: Blood pressure 126/82, pulse (!) 58, resp. rate 12, SpO2 95 %.  General: Alert, interactive, in no acute distress. HEENT: PERRLA, TMs pearly gray, turbinates minimally edematous without discharge, post-pharynx non erythematous. Neck: Supple without lymphadenopathy. Lungs: Clear to auscultation without wheezing, rhonchi or rales. {no increased work of breathing. CV: Normal S1, S2 without murmurs. Abdomen: Nondistended, nontender. Skin: Warm and dry, without lesions or rashes. Extremities:  No clubbing, cyanosis or edema. Neuro:   Grossly intact.  Diagnositics/Labs: None today  Assessment and plan:   Allergic rhinitis with recurrent sinusitis - improved however still continued nasal drip  Continue allergen avoidance measures   Flonase,1-2 sprays per nostril twice daily as needed for nasal congestion.  Use for 1-2 weeks at a time before stopping  Start nasal antihistamine, Patanase 2 sprays twice a day for maintenance control of post-nasal drip  Use nasal Atrovent 0.06% 2 sprays each nostril up to 3-4 times a day as needed for runny nose/post-nasal drip  Continue Nasal saline spray 1-2 times a day  Continue Zyrtec 10mg  daily  If allergen avoidance measures and medications fail to relieve symptoms consider allergy retest and course of immunotherapy (allergy shots)  If continues to have sinusitis will recommend CT sinus scan  From an allergy standpoint she is only complaining of rhinorrhea at this time.  She has the same risk as the general population for adverse effects related to anesthesia or surgery.  She has no respiratory complaints at this time.  Return in about 3-4 months or sooner if needed   I appreciate the opportunity to take part in Journii's care. Please do not hesitate to contact me with questions.  Sincerely,   Margo Aye,  MD Allergy/Immunology [facility]

## 2018-09-15 NOTE — Patient Instructions (Addendum)
Allergic rhinitis with recurrent sinusitis - improved however still continued nasal drip  Continue allergen avoidance measures   Flonase,1-2 sprays per nostril twice daily as needed for nasal congestion.  Use for 1-2 weeks at a time before stopping  Start nasal antihistamine, Patanase 2 sprays twice a day for maintenance control of post-nasal drip  Use nasal Atrovent 0.06% 2 sprays each nostril up to 3-4 times a day as needed for runny nose/post-nasal drip  Continue Nasal saline spray 1-2 times a day  Continue Zyrtec 10mg  daily  If allergen avoidance measures and medications fail to relieve symptoms consider allergy retest and course of immunotherapy (allergy shots)  If continues to have sinusitis will recommend CT sinus scan  Return in about 3-4 months or sooner if needed

## 2018-09-15 NOTE — Telephone Encounter (Signed)
Called pt to let her know that [facility] approved her surgery. Told pt to call with any questions and/or concerns she may have before surgery.

## 2018-09-28 DIAGNOSIS — M2041 Other hammer toe(s) (acquired), right foot: Secondary | ICD-10-CM | POA: Diagnosis not present

## 2018-09-28 DIAGNOSIS — M21541 Acquired clubfoot, right foot: Secondary | ICD-10-CM

## 2018-09-28 DIAGNOSIS — M2011 Hallux valgus (acquired), right foot: Secondary | ICD-10-CM

## 2018-10-02 ENCOUNTER — Telehealth: Payer: Self-pay

## 2018-10-02 ENCOUNTER — Encounter: Payer: Self-pay | Admitting: Podiatry

## 2018-10-02 NOTE — Telephone Encounter (Signed)
Called pt post-surgery; Left VM for patient informing them to take pain medication as needed, stay off of foot as much as possible and to call if they have any questions.

## 2018-10-09 ENCOUNTER — Encounter: Payer: Self-pay | Admitting: Podiatry

## 2018-10-09 ENCOUNTER — Other Ambulatory Visit: Payer: Self-pay

## 2018-10-09 ENCOUNTER — Ambulatory Visit (INDEPENDENT_AMBULATORY_CARE_PROVIDER_SITE_OTHER): Payer: 59

## 2018-10-09 ENCOUNTER — Ambulatory Visit (INDEPENDENT_AMBULATORY_CARE_PROVIDER_SITE_OTHER): Payer: 59 | Admitting: Podiatry

## 2018-10-09 DIAGNOSIS — M21611 Bunion of right foot: Secondary | ICD-10-CM

## 2018-10-09 DIAGNOSIS — M2041 Other hammer toe(s) (acquired), right foot: Secondary | ICD-10-CM

## 2018-10-09 DIAGNOSIS — Z9889 Other specified postprocedural states: Secondary | ICD-10-CM

## 2018-10-11 NOTE — Progress Notes (Signed)
   Subjective:  Patient presents today status post bunion and Tailor's bunionectomies right. DOS: 09/28/2018. She states she is doing well overall. She reports some tenderness to the dorsum of the foot. She reports associated swelling of the foot. She has been using the CAM boot as directed. There are no modifying factors noted. Patient is here for further evaluation and treatment.  Past Medical History:  Diagnosis Date  . Allergy   . Anemia   . Angio-edema   . Anxiety   . Arthritis   . Chronic headaches    Sinus  . Dysrhythmia    palpitations  . GERD (gastroesophageal reflux disease)   . Groin abscess   . Hypertension    since age 25  . Hypothyroidism   . PONV (postoperative nausea and vomiting)   . Recurrent upper respiratory infection (URI)   . Sleep difficulties   . Thyroid disease    hypothyroidism      Objective/Physical Exam Neurovascular status intact.  Skin incisions appear to be well coapted with sutures and staples intact. No sign of infectious process noted. No dehiscence. No active bleeding noted. Moderate edema noted to the surgical extremity.  Radiographic Exam:  Orthopedic hardware and osteotomies sites appear to be stable with routine healing.  Assessment: 1. s/p bunion and Tailor's bunionectomies right foot. DOS: 09/28/2018   Plan of Care:  1. Patient was evaluated. X-rays reviewed 2. Dressing changed. Keep clean, dry and intact for one week.  3. Discontinue using CAM boot.  4. Post op shoe dispensed. Weightbearing as tolerated. 5. Return to clinic in one week.    Felecia Shelling, DPM [facility]  Dr. Felecia Shelling, DPM    [address]                                        [place], [place] [address]                Office [phone]  Fax [phone]

## 2018-10-16 ENCOUNTER — Ambulatory Visit (INDEPENDENT_AMBULATORY_CARE_PROVIDER_SITE_OTHER): Payer: 59 | Admitting: Podiatry

## 2018-10-16 ENCOUNTER — Encounter: Payer: Self-pay | Admitting: Podiatry

## 2018-10-16 ENCOUNTER — Other Ambulatory Visit: Payer: Self-pay

## 2018-10-16 VITALS — BP 112/62 | HR 62 | Temp 98.2°F | Resp 16

## 2018-10-16 DIAGNOSIS — M21611 Bunion of right foot: Secondary | ICD-10-CM

## 2018-10-16 DIAGNOSIS — M21621 Bunionette of right foot: Secondary | ICD-10-CM

## 2018-10-16 DIAGNOSIS — Z9889 Other specified postprocedural states: Secondary | ICD-10-CM

## 2018-10-16 DIAGNOSIS — M2041 Other hammer toe(s) (acquired), right foot: Secondary | ICD-10-CM

## 2018-10-16 NOTE — Progress Notes (Signed)
   Subjective:  Patient presents today status post bunion and Tailor's bunionectomies right. DOS: 09/28/2018. She states she is doing well overall.  Patient does have some tenderness and numbness to the right fifth toe.  She has been wearing the postoperative shoe as directed.  No new complaints at this time.  Past Medical History:  Diagnosis Date  . Allergy   . Anemia   . Angio-edema   . Anxiety   . Arthritis   . Chronic headaches    Sinus  . Dysrhythmia    palpitations  . GERD (gastroesophageal reflux disease)   . Groin abscess   . Hypertension    since age 28  . Hypothyroidism   . PONV (postoperative nausea and vomiting)   . Recurrent upper respiratory infection (URI)   . Sleep difficulties   . Thyroid disease    hypothyroidism      Objective/Physical Exam Neurovascular status intact.  Skin incisions appear to be well coapted with sutures intact. No sign of infectious process noted. No dehiscence. No active bleeding noted. Moderate edema noted to the surgical extremity.  Assessment: 1. s/p bunion and Tailor's bunionectomies right foot. DOS: 09/28/2018   Plan of Care:  1. Patient was evaluated.  2.  Sutures were removed today 3.  Compression anklet dispensed 4.  Continue weightbearing in the postoperative shoe 5.  Return to clinic in 3 weeks for follow-up x-rays and to transition the patient into good tennis shoes  Felecia Shelling, DPM [facility]  Dr. Felecia Shelling, DPM    [address]                                        [place], [place] [address]                Office [phone]  Fax [phone]

## 2018-10-30 ENCOUNTER — Encounter: Payer: 59 | Admitting: Podiatry

## 2018-11-06 ENCOUNTER — Encounter: Payer: Self-pay | Admitting: Podiatry

## 2018-11-06 ENCOUNTER — Other Ambulatory Visit: Payer: Self-pay

## 2018-11-06 ENCOUNTER — Ambulatory Visit (INDEPENDENT_AMBULATORY_CARE_PROVIDER_SITE_OTHER): Payer: 59

## 2018-11-06 ENCOUNTER — Ambulatory Visit (INDEPENDENT_AMBULATORY_CARE_PROVIDER_SITE_OTHER): Payer: 59 | Admitting: Podiatry

## 2018-11-06 VITALS — Temp 97.3°F

## 2018-11-06 DIAGNOSIS — Z9889 Other specified postprocedural states: Secondary | ICD-10-CM

## 2018-11-06 DIAGNOSIS — M21621 Bunionette of right foot: Secondary | ICD-10-CM | POA: Diagnosis not present

## 2018-11-06 DIAGNOSIS — M21611 Bunion of right foot: Secondary | ICD-10-CM

## 2018-11-06 DIAGNOSIS — M2041 Other hammer toe(s) (acquired), right foot: Secondary | ICD-10-CM

## 2018-11-06 NOTE — Progress Notes (Signed)
   Subjective:  Patient presents today status post bunion and Tailor's bunionectomies right. DOS: 09/28/2018. She states she is doing well overall.  Patient continues to have some swelling and stiffness to the toes.  She has been weightbearing and walking in the postoperative shoe as directed.   Past Medical History:  Diagnosis Date  . Allergy   . Anemia   . Angio-edema   . Anxiety   . Arthritis   . Chronic headaches    Sinus  . Dysrhythmia    palpitations  . GERD (gastroesophageal reflux disease)   . Groin abscess   . Hypertension    since age 47  . Hypothyroidism   . PONV (postoperative nausea and vomiting)   . Recurrent upper respiratory infection (URI)   . Sleep difficulties   . Thyroid disease    hypothyroidism      Objective/Physical Exam Neurovascular status intact.  Skin incisions appear to be well coapted. No sign of infectious process noted. Moderate edema noted to the surgical extremity.  There is some limited range of motion and stiffness to the surgical bunion and tailor's bunion.   Assessment: 1. s/p bunion and Tailor's bunionectomies right foot. DOS: 09/28/2018   Plan of Care:  1. Patient was evaluated.  2.  Patient may discontinue postoperative shoe.  Recommend good supportive sneakers 3.  Recommend daily range of motion exercises to the surgical areas 4.  Note for work was provided: Work from home 4-hour shifts only beginning 11/20/2018.  Return to work full activity no restrictions beginning 11/26/2018 5.  Return to clinic in 2 months   Felecia Shelling, DPM [facility]  Dr. Felecia Shelling, DPM    [address]                                        [place], [place] [address]                Office [phone]  Fax [phone]

## 2018-11-13 ENCOUNTER — Encounter: Payer: 59 | Admitting: Podiatry

## 2018-12-13 ENCOUNTER — Ambulatory Visit (INDEPENDENT_AMBULATORY_CARE_PROVIDER_SITE_OTHER): Payer: 59 | Admitting: Podiatry

## 2018-12-13 ENCOUNTER — Other Ambulatory Visit: Payer: Self-pay

## 2018-12-13 ENCOUNTER — Encounter: Payer: Self-pay | Admitting: Podiatry

## 2018-12-13 VITALS — Temp 97.9°F

## 2018-12-13 DIAGNOSIS — M21611 Bunion of right foot: Secondary | ICD-10-CM

## 2018-12-13 DIAGNOSIS — M2041 Other hammer toe(s) (acquired), right foot: Secondary | ICD-10-CM

## 2018-12-13 DIAGNOSIS — Z9889 Other specified postprocedural states: Secondary | ICD-10-CM

## 2018-12-15 ENCOUNTER — Encounter: Payer: Self-pay | Admitting: Allergy

## 2018-12-15 ENCOUNTER — Telehealth (INDEPENDENT_AMBULATORY_CARE_PROVIDER_SITE_OTHER): Payer: 59 | Admitting: Allergy

## 2018-12-15 ENCOUNTER — Ambulatory Visit: Payer: 59 | Admitting: Allergy

## 2018-12-15 DIAGNOSIS — J3089 Other allergic rhinitis: Secondary | ICD-10-CM | POA: Diagnosis not present

## 2018-12-15 NOTE — Progress Notes (Signed)
RE: Regina Daniels MRN: [id number] DOB: [birth year] Date of Telemedicine Visit: 12/15/2018  Referring provider: Burton Apleyoberts, Ronald, MD Primary care provider: Burton Apleyoberts, Ronald, MD  Chief Complaint: Allergic Rhinitis    Telemedicine Follow Up Visit via Telephone: I connected with Regina Daniels for a follow up on 12/15/18 by telephone and verified that I am speaking with the correct person using two identifiers.   I discussed the limitations, risks, security and privacy concerns of performing an evaluation and management service by telephone and the availability of in person appointments. I also discussed with the patient that there may be a patient responsible charge related to this service. The patient expressed understanding and agreed to proceed.  Patient is at home.  Provider is at the office.  Visit start time: 0948 Visit end time: [id number] Insurance consent/check in by: Marlene BastMarie C Medical consent and medical assistant/nurse: Meade MawKelli S  History of Present Illness: She is a 63 y.o. female, who is being followed for allergic rhinitis. Her previous allergy office visit was on 09/15/2018 with Dr. Delorse LekPadgett.   She had foot surgery in March and states surgery went well; she has some residual swelling still.    She states she has been staying home mostly since the coronavirus and thus her allergy symptoms have been much better.  She states this has been the best spring season for her in a while.   She is using both flonase and patanase nasal sprays.  She does feel that the patanase does work well to help control her post-nasal drip.  She also has nasal atrovent for as needed use but states she has not needed to use this much with use of the patanase.  She also continues to take zyrtec daily.   She has not had any sinus infections or need for antibiotics since her last visit.   Assessment and Plan: Regina Daniels is a 63 y.o. female with:   Allergic rhinitis with recurrent sinusitis - improved   Continue allergen  avoidance measures   Flonase,1-2 sprays per nostril twice daily as needed for nasal congestion.  Use for 1-2 weeks at a time before stopping  Patanase (nasal antihistamine)  2 sprays twice a day for maintenance control of post-nasal drip  Ipratropium (Atrovent) 0.06% 2 sprays each nostril up to 3-4 times a day as needed for runny nose/post-nasal drip  Continue Nasal saline spray 1-2 times a day  Continue Zyrtec 10mg  daily  If allergen avoidance measures and medications fail to relieve symptoms consider allergy retest and course of immunotherapy (allergy shots)  If continues to have sinusitis will recommend CT sinus scan  Return in about 6 months or sooner if needed   Diagnostics: None.  Medication List:  Current Outpatient Medications  Medication Sig Dispense Refill  . ATENOLOL PO Take 25 mg by mouth daily.     . cetirizine (ZYRTEC) 10 MG tablet Take 10 mg by mouth daily.      . clobetasol cream (TEMOVATE) 0.05 % Apply 1 application topically as needed.   0  . estradiol (ESTRACE) 1 MG tablet TK 1 T PO QD    . fluticasone (FLONASE) 50 MCG/ACT nasal spray Place into the nose.    Marland Kitchen. ipratropium (ATROVENT) 0.06 % nasal spray Place 2 sprays into both nostrils 3 (three) times daily. 15 mL 5  . levothyroxine (SYNTHROID, LEVOTHROID) 50 MCG tablet Take 50 mcg by mouth daily before breakfast.    . pantoprazole (PROTONIX) 40 MG tablet Take 40 mg by mouth as needed.     .Marland Kitchen  potassium chloride (K-DUR) 10 MEQ tablet TK 1 T PO QD FOR POTASSIUM    . potassium chloride (K-DUR,KLOR-CON) 10 MEQ tablet Take 10 mEq by mouth daily.  0  . simvastatin (ZOCOR) 20 MG tablet     . triamterene-hydrochlorothiazide (MAXZIDE) 75-50 MG per tablet Take 1 tablet by mouth daily.       No current facility-administered medications for this visit.    Allergies: Allergies  Allergen Reactions  . Other     Steroid injections-headaches  . Prednisone Other (See Comments)    headache  . Sudafed [Pseudoephedrine Hcl]      Pt stated, "Makes me feel wired and keeps me up all night"  . Demerol Hives and Rash    All over body  . Epinephrine Palpitations   I reviewed her past medical history, social history, family history, and environmental history and no significant changes have been reported from previous visit on 09/15/2018.  Review of Systems  Constitutional: Negative for chills and fever.  HENT: Positive for rhinorrhea. Negative for congestion and sneezing.   Eyes: Negative for discharge, redness and itching.  Respiratory: Negative for cough, chest tightness, shortness of breath and wheezing.   Cardiovascular: Negative.   Gastrointestinal: Negative.   Musculoskeletal: Negative for myalgias.  Skin: Negative for rash.  Neurological: Negative for headaches.   Objective: Physical Exam Not obtained as encounter was done via telephone.   Previous notes and tests were reviewed.  I discussed the assessment and treatment plan with the patient. The patient was provided an opportunity to ask questions and all were answered. The patient agreed with the plan and demonstrated an understanding of the instructions.   The patient was advised to call back or seek an in-person evaluation if the symptoms worsen or if the condition fails to improve as anticipated.  I provided 20 minutes of non-face-to-face time during this encounter.  It was my pleasure to participate in Regina Daniels's care today. Please feel free to contact me with any questions or concerns.   Sincerely,  Karen Huhta Larose Hires, MD

## 2018-12-15 NOTE — Patient Instructions (Addendum)
Allergic rhinitis with recurrent sinusitis - improved   Continue allergen avoidance measures   Flonase,1-2 sprays per nostril twice daily as needed for nasal congestion.  Use for 1-2 weeks at a time before stopping  Patanase (nasal antihistamine)  2 sprays twice a day for maintenance control of post-nasal drip  Ipratropium (Atrovent) 0.06% 2 sprays each nostril up to 3-4 times a day as needed for runny nose/post-nasal drip  Continue Nasal saline spray 1-2 times a day  Continue Zyrtec 10mg  daily  If allergen avoidance measures and medications fail to relieve symptoms consider allergy retest and course of immunotherapy (allergy shots)  If continues to have sinusitis will recommend CT sinus scan  Return in about 6 months or sooner if needed

## 2018-12-17 NOTE — Progress Notes (Signed)
   Subjective:  Patient presents today status post bunion and Tailor's bunionectomies right. DOS: 09/28/2018. She reports continued swelling. Wearing the compression socks have not been helping. She states she can only wear crocs on the surgical foot. She also notes difficulty with movement of the foot. Patient is here for further evaluation and treatment.   Past Medical History:  Diagnosis Date  . Allergy   . Anemia   . Angio-edema   . Anxiety   . Arthritis   . Chronic headaches    Sinus  . Dysrhythmia    palpitations  . GERD (gastroesophageal reflux disease)   . Groin abscess   . Hypertension    since age 63  . Hypothyroidism   . PONV (postoperative nausea and vomiting)   . Recurrent upper respiratory infection (URI)   . Sleep difficulties   . Thyroid disease    hypothyroidism      Objective/Physical Exam Neurovascular status intact.  Skin incisions appear to be well coapted. No sign of infectious process noted. Moderate edema noted to the surgical extremity.  There is some limited range of motion and stiffness to the surgical bunion and tailor's bunion.   Assessment: 1. s/p bunion and Tailor's bunionectomies right foot. DOS: 09/28/2018   Plan of Care:  1. Patient was evaluated.  2. May resume full activity with no restrictions.  3. Recommended good shoe gear.  4. Recommended compression socks daily.  5. Return to clinic as needed.    Felecia Shelling, DPM [facility]  Dr. Felecia Shelling, DPM    [address]                                        [place], [place] [address]                Office [phone]  Fax [phone]

## 2019-01-08 ENCOUNTER — Ambulatory Visit: Payer: 59 | Admitting: Podiatry

## 2019-06-06 ENCOUNTER — Encounter: Payer: Self-pay | Admitting: Allergy

## 2019-06-06 ENCOUNTER — Ambulatory Visit (INDEPENDENT_AMBULATORY_CARE_PROVIDER_SITE_OTHER): Payer: 59 | Admitting: Allergy

## 2019-06-06 ENCOUNTER — Other Ambulatory Visit: Payer: Self-pay

## 2019-06-06 DIAGNOSIS — J3089 Other allergic rhinitis: Secondary | ICD-10-CM | POA: Diagnosis not present

## 2019-06-06 MED ORDER — OLOPATADINE HCL 0.6 % NA SOLN: 2.0000 | Freq: Two times a day (BID) | NASAL | 5 refills | Status: DC | PRN

## 2019-06-06 MED ORDER — IPRATROPIUM BROMIDE 0.06 % NA SOLN: 2.0000 | Freq: Three times a day (TID) | NASAL | 5 refills | Status: DC

## 2019-06-06 NOTE — Patient Instructions (Addendum)
Allergic rhinitis with history of recurrent sinusitis   Continue allergen avoidance measures   Flonase,1-2 sprays per nostril twice daily as needed for nasal congestion.  Use for 1-2 weeks at a time before stopping  Patanase (nasal antihistamine)  2 sprays twice a day for maintenance control of post-nasal drip  Ipratropium (Atrovent) 0.06% 2 sprays each nostril up to 3-4 times a day as needed for runny nose/post-nasal drip  Continue Nasal saline spray 1-2 times a day  Continue Zyrtec 10mg  daily  If allergen avoidance measures and medications fail to relieve symptoms consider allergy retest and course of immunotherapy (allergy shots)  If continues to have sinusitis will recommend CT sinus scan  Return in about 6 months or sooner if needed

## 2019-06-06 NOTE — Progress Notes (Signed)
RE: TAKEELA PEIL MRN: [id number] DOB: [birth year] Date of Telemedicine Visit: 06/06/2019  Referring provider: Lorene Dy, MD Primary care provider: Lorene Dy, MD  Chief Complaint: Follow-up (no concers or questions)   Telemedicine Follow Up Visit via Telephone: I connected with Ziara Thelander for a follow up on 06/06/19 by telephone and verified that I am speaking with the correct person using two identifiers.   I discussed the limitations, risks, security and privacy concerns of performing an evaluation and management service by telephone and the availability of in person appointments. I also discussed with the patient that there may be a patient responsible charge related to this service. The patient expressed understanding and agreed to proceed.  Patient is at home.  Provider is at the office.  Visit start time: 7510 Visit end time: [place] consent/check in by: Kirke Corin Medical consent and medical assistant/nurse: Wilnette Kales  History of Present Illness: She is a 63 y.o. female, who is being followed for allergic rhinitis. Her previous allergy office visit was on 01/26/2019 with Dr. Nelva Bush.   Since last visit she did have surgery on her shoulder on last Wednesday.  She is currently doing physical therapy.  She states that she had issues with the arm after receiving her flu vaccine on September 12.  She states while receiving the flu vaccine her fingers felt a bit tingly and then shortly later she felt like she could not move her arm and had a lot of pain.  She states this arm previously had surgery for a rotator cuff repair.  This was the first time she had gotten any type of shot or vaccine in this arm.  She had an MRI of the arm and and had a cortisone injections and ultimately underwent a surgery.    She states from her allergic rhinitis she is doing relatively well.  She has good and bad days with her drainage.  She is alternating between Claritin and Zyrtec.  She  states she will complete a bottle of Zyrtec and then changed to Claritin and continue alternating like that.  She does have nasal sprays including Flonase, Patanase and ipratropium.  She is using all of these to help with her nasal drainage and congestion control.  She is not currently performing any nasal saline rinses due to her arm not being able to lift the water jug.  Otherwise she has not had any need for antibiotics since her last visit for her sinuses.  Assessment and Plan: Shatasha is a 63 y.o. female with:   Allergic rhinitis with history of recurrent sinusitis   Continue allergen avoidance measures   Flonase,1-2 sprays per nostril twice daily as needed for nasal congestion.  Use for 1-2 weeks at a time before stopping  Patanase (nasal antihistamine)  2 sprays twice a day for maintenance control of post-nasal drip  Ipratropium (Atrovent) 0.06% 2 sprays each nostril up to 3-4 times a day as needed for runny nose/post-nasal drip  Continue Nasal saline spray 1-2 times a day  Continue Zyrtec 10mg  daily  If allergen avoidance measures and medications fail to relieve symptoms consider allergy retest and course of immunotherapy (allergy shots)  If continues to have sinusitis will recommend CT sinus scan  Return in about 6 months or sooner if needed      Diagnostics: None.  Medication List:  Current Outpatient Medications  Medication Sig Dispense Refill  . ATENOLOL PO Take 25 mg by mouth daily.     . cetirizine (  ZYRTEC) 10 MG tablet Take 10 mg by mouth daily.      . clobetasol cream (TEMOVATE) 0.05 % Apply 1 application topically as needed.   0  . estradiol (ESTRACE) 1 MG tablet TK 1 T PO QD    . fluticasone (FLONASE) 50 MCG/ACT nasal spray Place into the nose.    Marland Kitchen ipratropium (ATROVENT) 0.06 % nasal spray Place 2 sprays into both nostrils 3 (three) times daily. 15 mL 5  . levothyroxine (SYNTHROID, LEVOTHROID) 50 MCG tablet Take 50 mcg by mouth daily before breakfast.    .  meloxicam (MOBIC) 15 MG tablet Take 15 mg by mouth daily.    . pantoprazole (PROTONIX) 40 MG tablet Take 40 mg by mouth as needed.     . potassium chloride (K-DUR) 10 MEQ tablet TK 1 T PO QD FOR POTASSIUM    . potassium chloride (K-DUR,KLOR-CON) 10 MEQ tablet Take 10 mEq by mouth daily.  0  . simvastatin (ZOCOR) 20 MG tablet     . triamterene-hydrochlorothiazide (MAXZIDE) 75-50 MG per tablet Take 1 tablet by mouth daily.       No current facility-administered medications for this visit.    Allergies: Allergies  Allergen Reactions  . Other     Steroid injections-headaches  . Prednisone Other (See Comments)    headache  . Sudafed [Pseudoephedrine Hcl]     Pt stated, "Makes me feel wired and keeps me up all night"  . Demerol Hives and Rash    All over body  . Epinephrine Palpitations   I reviewed her past medical history, social history, family history, and environmental history and no significant changes have been reported from previous visit on 01/26/2019.  Review of Systems  Constitutional: Negative for chills and fever.  HENT: Positive for postnasal drip and rhinorrhea. Negative for sinus pressure and sinus pain.   Eyes: Negative.   Respiratory: Negative.   Cardiovascular: Negative.   Gastrointestinal: Negative.   Genitourinary: Negative.   Musculoskeletal:       See HPI  Skin: Negative.   Allergic/Immunologic: Positive for environmental allergies.  Neurological: Negative.    Objective: Physical Exam Not obtained as encounter was done via telephone.   Previous notes and tests were reviewed.  I discussed the assessment and treatment plan with the patient. The patient was provided an opportunity to ask questions and all were answered. The patient agreed with the plan and demonstrated an understanding of the instructions.   The patient was advised to call back or seek an in-person evaluation if the symptoms worsen or if the condition fails to improve as anticipated.  I  provided 13 minutes of non-face-to-face time during this encounter.  It was my pleasure to participate in [place] Linam's care today. Please feel free to contact me with any questions or concerns.   Sincerely,   Larose Hires, MD

## 2019-11-10 ENCOUNTER — Other Ambulatory Visit: Payer: Self-pay | Admitting: Allergy

## 2019-12-05 ENCOUNTER — Other Ambulatory Visit: Payer: Self-pay

## 2019-12-05 ENCOUNTER — Encounter: Payer: Self-pay | Admitting: Allergy

## 2019-12-05 ENCOUNTER — Ambulatory Visit: Payer: 59 | Admitting: Allergy

## 2019-12-05 VITALS — BP 130/90 | HR 58 | Temp 97.2°F | Resp 16 | Ht 65.5 in | Wt 208.8 lb

## 2019-12-05 DIAGNOSIS — S70361A Insect bite (nonvenomous), right thigh, initial encounter: Secondary | ICD-10-CM | POA: Diagnosis not present

## 2019-12-05 DIAGNOSIS — J3089 Other allergic rhinitis: Secondary | ICD-10-CM | POA: Diagnosis not present

## 2019-12-05 DIAGNOSIS — W57XXXA Bitten or stung by nonvenomous insect and other nonvenomous arthropods, initial encounter: Secondary | ICD-10-CM

## 2019-12-05 MED ORDER — DOXYCYCLINE MONOHYDRATE 100 MG PO TABS: 100.0000 mg | ORAL_TABLET | Freq: Two times a day (BID) | ORAL | 0 refills | Status: AC

## 2019-12-05 NOTE — Patient Instructions (Addendum)
Allergic rhinitis with history of recurrent sinusitis   Continue allergen avoidance measures.  Will obtain environmental allergy panel to see if you have developed other allergens besides tree pollen and molds.  Flonase,1-2 sprays per nostril twice daily as needed for nasal congestion.  Use for 1-2 weeks at a time before stopping  Patanase (nasal antihistamine)  2 sprays twice a day for maintenance control of post-nasal drip  Ipratropium (Atrovent) 0.06% 2 sprays each nostril up to 3-4 times a day as needed for runny nose/post-nasal drip  Continue Nasal saline spray 1-2 times a day  Recommend changing Zyrtec to Allegra 180mg  or Xyzal 5mg  daily  If allergen avoidance measures and medications fail to relieve symptoms consider allergy shots discussed today.   If continues to have sinusitis will recommend CT sinus scan  Tick bite  Due to tick being embedded for a while and unsure if entire body was removed will treat prophylactically with Doxycycline 100mg  1 tab twice a day for 10 day (take with food)   Return in about 4-6 months or sooner if needed

## 2019-12-05 NOTE — Progress Notes (Signed)
Follow-up Note  RE: MAYLEEN BORRERO MRN: [id number] DOB: 1956/05/18 Date of Office Visit: 12/05/2019   History of present illness: Regina Daniels is a 64 y.o. female presenting today for follow-up of allergic rhinitis with history of recurrent sinusitis.  Her last visit was a telemedicine visit on 06/06/2019 by myself.  She also wants to discuss a recent tick bite.  She had a tick bite that she realized she had about 10 days in and pulled it out when she saw it.  She states the tick was quite embedded.  When she pulled it out she is not sure that she got the entire body and head.  She states since pulling it out the area is red and itches.  Denies any fevers or chills.  No joint aches or pains at this time.    With her allergic rhinitis she states it is improved but she does still have some nasal drainage and still needs to have access to tissues during the day.  She is using Patanase 2 sprays twice a day as well as the nasal Atrovent however is not maximized on this use.  She also has Flonase for nasal congestion.  She does take Zyrtec daily. She has not had any recent sinus infections or need for antibiotics for her sinus infections.     Review of systems: Review of Systems  Constitutional: Negative.   HENT:       See HPI  Eyes: Negative.   Respiratory: Negative.   Cardiovascular: Negative.   Gastrointestinal: Negative.   Musculoskeletal: Negative.   Skin:       See HPI  Neurological: Negative.     All other systems negative unless noted above in HPI  Past medical/social/surgical/family history have been reviewed and are unchanged unless specifically indicated below.  No changes  Medication List: Current Outpatient Medications  Medication Sig Dispense Refill  . acyclovir ointment (ZOVIRAX) 5 % Apply topically See admin instructions.    Marland Kitchen atenolol (TENORMIN) 25 MG tablet Take 25 mg by mouth daily.    . cetirizine (ZYRTEC) 10 MG tablet Take 10 mg by mouth daily.      .  clindamycin (CLEOCIN T) 1 % lotion     . clobetasol cream (TEMOVATE) 0.05 % Apply 1 application topically as needed.   0  . estradiol (ESTRACE) 1 MG tablet TK 1 T PO QD    . fluticasone (FLONASE) 50 MCG/ACT nasal spray Place into the nose.    . ibuprofen (ADVIL) 800 MG tablet ibuprofen 800 mg tablet  TK 1 T PO TID    . ipratropium (ATROVENT) 0.06 % nasal spray USE 2 SPRAYS IN EACH NOSTRIL THREE TIMES DAILY 15 mL 0  . levothyroxine (SYNTHROID, LEVOTHROID) 50 MCG tablet Take 50 mcg by mouth daily before breakfast.    . meloxicam (MOBIC) 15 MG tablet Take 15 mg by mouth daily.    . Olopatadine HCl 0.6 % SOLN Place 2 sprays into the nose 2 (two) times daily as needed. 30.5 g 5  . pantoprazole (PROTONIX) 40 MG tablet Take 40 mg by mouth as needed.     . potassium chloride (KLOR-CON) 10 MEQ tablet potassium chloride ER 10 mEq tablet,extended release  TK 1 T PO D    . rosuvastatin (CRESTOR) 10 MG tablet Take 10 mg by mouth daily.    Marland Kitchen triamterene-hydrochlorothiazide (MAXZIDE) 75-50 MG per tablet Take 1 tablet by mouth daily.      Marland Kitchen doxycycline (ADOXA) 100 MG  tablet Take 1 tablet (100 mg total) by mouth 2 (two) times daily for 10 days. 20 tablet 0   No current facility-administered medications for this visit.     Known medication allergies: Allergies  Allergen Reactions  . Other     Steroid injections-headaches  . Prednisone Other (See Comments)    headache  . Sudafed [Pseudoephedrine Hcl]     Pt stated, "Makes me feel wired and keeps me up all night"  . Demerol Hives and Rash    All over body  . Epinephrine Palpitations     Physical examination: Blood pressure 130/90, pulse (!) 58, temperature (!) 97.2 F (36.2 C), temperature source Temporal, resp. rate 16, height 5' 5.5" (1.664 m), weight 208 lb 12.8 oz (94.7 kg), SpO2 96 %.  General: Alert, interactive, in no acute distress. HEENT: TMs pearly gray, turbinates minimally edematous with clear discharge, post-pharynx non  erythematous. Neck: Supple without lymphadenopathy. Lungs: Clear to auscultation without wheezing, rhonchi or rales. {no increased work of breathing. CV: Normal S1, S2 without murmurs. Abdomen: Nondistended, nontender. Skin: Right medial thigh with a eraser sized area of erythema. Extremities:  No clubbing, cyanosis or edema. Neuro:   Grossly intact.  Diagnositics/Labs: None today  Assessment and plan:   Allergic rhinitis with history of recurrent sinusitis   Continue allergen avoidance measures.  Will obtain environmental allergy panel to see if you have developed other allergens besides tree pollen and molds.  Flonase,1-2 sprays per nostril twice daily as needed for nasal congestion.  Use for 1-2 weeks at a time before stopping  Patanase (nasal antihistamine)  2 sprays twice a day for maintenance control of post-nasal drip  Ipratropium (Atrovent) 0.06% 2 sprays each nostril up to 3-4 times a day as needed for runny nose/post-nasal drip  Continue Nasal saline spray 1-2 times a day  Recommend changing Zyrtec to Allegra 180mg  or Xyzal 5mg  daily  If allergen avoidance measures and medications fail to relieve symptoms consider allergy shots discussed today.   If continues to have sinusitis will recommend CT sinus scan  Tick bite  Due to tick being embedded for a while (> 36 hr) and unsure if entire body was removed will treat prophylactically with Doxycycline 100mg  1 tab twice a day for 10 day (take with food)   Return in about 4-6 months or sooner if needed   I appreciate the opportunity to take part in Regina Daniels's care. Please do not hesitate to contact me with questions.  Sincerely,   Prudy Feeler, MD Allergy/Immunology [facility]

## 2019-12-09 LAB — ALLERGENS W/TOTAL IGE AREA 2

## 2019-12-24 ENCOUNTER — Ambulatory Visit: Payer: 59 | Admitting: Podiatry

## 2019-12-24 ENCOUNTER — Encounter: Payer: Self-pay | Admitting: Podiatry

## 2019-12-24 ENCOUNTER — Other Ambulatory Visit: Payer: Self-pay

## 2019-12-24 ENCOUNTER — Ambulatory Visit (INDEPENDENT_AMBULATORY_CARE_PROVIDER_SITE_OTHER): Payer: 59

## 2019-12-24 DIAGNOSIS — M7671 Peroneal tendinitis, right leg: Secondary | ICD-10-CM | POA: Diagnosis not present

## 2019-12-25 NOTE — Progress Notes (Signed)
   HPI: 64 y.o. female presenting today with a new complaint regarding pain and swelling to the lateral aspect of the right foot and ankle.  Patient states that she develops a shooting pain and applying pressure aggravates it.  She is also noticed some slight swelling.  Patient had surgery March 2020 consisting of bunion and tailor's bunionectomies to the right foot.  Patient is now having some pain around the area and she would like it evaluated today.  Past Medical History:  Diagnosis Date  . Allergy   . Anemia   . Angio-edema   . Anxiety   . Arthritis   . Chronic headaches    Sinus  . Dysrhythmia    palpitations  . GERD (gastroesophageal reflux disease)   . Groin abscess   . Hypertension    since age 44  . Hypothyroidism   . PONV (postoperative nausea and vomiting)   . Recurrent upper respiratory infection (URI)   . Sleep difficulties   . Thyroid disease    hypothyroidism     Physical Exam: General: The patient is alert and oriented x3 in no acute distress.  Dermatology: Skin is warm, dry and supple bilateral lower extremities. Negative for open lesions or macerations.  Vascular: Palpable pedal pulses bilaterally.  There is some moderate edema noted to the right foot that appears chronic in nature.  Edema seems to be mostly at the dorsal aspect of the foot.. Capillary refill within normal limits.  Neurological: Epicritic and protective threshold grossly intact bilaterally.   Musculoskeletal Exam: Tenderness to palpation at the insertion of the peroneal tendon at the fifth metatarsal base right foot consistent with insertional peroneal tendinitis right.  range of motion within normal limits to all pedal and ankle joints bilateral. Muscle strength 5/5 in all groups bilateral.   Radiographic Exam:  Normal osseous mineralization. Joint spaces preserved. No fracture/dislocation/boney destruction.    Assessment: 1.  S/P bunion and tailor's bunionectomies right foot.  DOS:  09/28/2018 2.  Insertional peroneal tendinitis right 3.  Moderate edema right foot   Plan of Care:  1. Patient evaluated. X-Rays reviewed.  2.  Injection of 0.5 cc Celestone Soluspan injected at the insertional area of the peroneal tendon right foot 3.  Resume meloxicam 15 mg daily 4.  Immobilization cam boot provided today.  Weightbearing as tolerated x4 weeks 5.  Compression anklet dispensed 6.  Return to clinic in 4 weeks      Felecia Shelling, DPM [facility]  Dr. Felecia Shelling, DPM    [address] [place], [place] [address]                Office [phone]  Fax [phone]

## 2020-01-14 ENCOUNTER — Ambulatory Visit: Payer: 59 | Admitting: Podiatry

## 2020-01-23 ENCOUNTER — Ambulatory Visit: Payer: 59 | Admitting: Podiatry

## 2020-01-23 ENCOUNTER — Other Ambulatory Visit: Payer: Self-pay

## 2020-01-23 ENCOUNTER — Encounter: Payer: Self-pay | Admitting: Podiatry

## 2020-01-23 DIAGNOSIS — M7671 Peroneal tendinitis, right leg: Secondary | ICD-10-CM

## 2020-01-23 MED ORDER — MELOXICAM 15 MG PO TABS: 15.0000 mg | ORAL_TABLET | Freq: Every day | ORAL | 0 refills | Status: DC

## 2020-01-23 NOTE — Progress Notes (Signed)
   HPI: 64 y.o. female presenting today for follow-up treatment evaluation regarding peroneal tendinitis to the right foot.  Patient states that she no longer has pain.  She still has some moderate edema to the right foot however the pain has gone.  She wear the cam boot for 2 weeks and has since been wearing good supportive tennis shoes.  She states that the injection and meloxicam helped significantly.  No new complaints at this time  Past Medical History:  Diagnosis Date  . Allergy   . Anemia   . Angio-edema   . Anxiety   . Arthritis   . Chronic headaches    Sinus  . Dysrhythmia    palpitations  . GERD (gastroesophageal reflux disease)   . Groin abscess   . Hypertension    since age 56  . Hypothyroidism   . PONV (postoperative nausea and vomiting)   . Recurrent upper respiratory infection (URI)   . Sleep difficulties   . Thyroid disease    hypothyroidism     Physical Exam: General: The patient is alert and oriented x3 in no acute distress.  Dermatology: Skin is warm, dry and supple bilateral lower extremities. Negative for open lesions or macerations.  Vascular: Palpable pedal pulses bilaterally.  There is some moderate edema noted to the right foot that appears chronic in nature.  Edema seems to be mostly at the dorsal aspect of the foot.. Capillary refill within normal limits.  Neurological: Epicritic and protective threshold grossly intact bilaterally.   Musculoskeletal Exam: Negative for tenderness to palpation at the insertion of the peroneal tendon at the fifth metatarsal base right foot consistent with insertional peroneal tendinitis right.  range of motion within normal limits to all pedal and ankle joints bilateral. Muscle strength 5/5 in all groups bilateral.   Radiographic Exam:  Normal osseous mineralization. Joint spaces preserved. No fracture/dislocation/boney destruction.    Assessment: 1.  S/P bunion and tailor's bunionectomies right foot.  DOS:  09/28/2018 2.  Insertional peroneal tendinitis right 3.  Moderate edema right foot   Plan of Care:  1. Patient evaluated. 2.  Refill prescription for meloxicam 15 mg.  Take daily as needed 3.  Continue compression ankle sleeve as needed 4.  Discontinue cam boot.  Recommend good supportive tennis shoes 5.  Return to clinic as needed     Felecia Shelling, DPM [facility]  Dr. Felecia Shelling, DPM    [address] [place], [place] [address]                Office [phone]  Fax [phone]

## 2020-03-19 ENCOUNTER — Ambulatory Visit: Payer: 59 | Admitting: Allergy

## 2020-03-19 ENCOUNTER — Encounter: Payer: Self-pay | Admitting: Allergy

## 2020-03-19 ENCOUNTER — Other Ambulatory Visit: Payer: Self-pay

## 2020-03-19 VITALS — BP 130/80 | HR 55 | Temp 98.1°F | Resp 18 | Ht 65.0 in | Wt 203.4 lb

## 2020-03-19 DIAGNOSIS — J3089 Other allergic rhinitis: Secondary | ICD-10-CM

## 2020-03-19 NOTE — Progress Notes (Signed)
Follow-up Note  RE: Regina Daniels MRN: [id number] DOB: 1955-12-04 Date of Office Visit: 03/19/2020   History of present illness: Regina Daniels is a 64 y.o. female presenting today for skin testing visit.  She was last seen in the office on 12/05/19 by myself.  At that time she had environmental allergy testing via serum IgE that was negative thus recommended skin testing to see if she had any sensitivities at this time.   She has had antihistamine for past 3 days.  She has not noted any significant increase in symptoms other than runny nose.  She states she does have days well she will have increase congestion, sinus pressure, ear pain.  Also reports having raspy voice and sometimes with raising voice it causes her to cough.    Review of systems: Review of Systems  Constitutional: Negative.   HENT:       See HPI  Eyes: Negative.   Respiratory: Positive for cough.   Cardiovascular: Negative.   Gastrointestinal: Negative.   Musculoskeletal: Negative.   Skin: Negative.   Neurological: Negative.     All other systems negative unless noted above in HPI  Past medical/social/surgical/family history have been reviewed and are unchanged unless specifically indicated below.  No changes  Medication List: Current Outpatient Medications  Medication Sig Dispense Refill  . acyclovir ointment (ZOVIRAX) 5 % Apply topically See admin instructions.    Marland Kitchen atenolol (TENORMIN) 25 MG tablet Take 25 mg by mouth daily.    . clindamycin (CLEOCIN T) 1 % lotion     . fexofenadine (ALLEGRA) 30 MG/5ML suspension Take 30 mg by mouth daily.    . fluticasone (FLONASE) 50 MCG/ACT nasal spray Place into the nose.    . ibuprofen (ADVIL) 800 MG tablet ibuprofen 800 mg tablet  TK 1 T PO TID    . ipratropium (ATROVENT) 0.06 % nasal spray USE 2 SPRAYS IN EACH NOSTRIL THREE TIMES DAILY 15 mL 0  . levothyroxine (SYNTHROID, LEVOTHROID) 50 MCG tablet Take 50 mcg by mouth daily before breakfast.    . meloxicam  (MOBIC) 15 MG tablet Take 1 tablet (15 mg total) by mouth daily. 30 tablet 0  . pantoprazole (PROTONIX) 40 MG tablet Take 40 mg by mouth as needed.     . potassium chloride (KLOR-CON) 10 MEQ tablet potassium chloride ER 10 mEq tablet,extended release  TK 1 T PO D    . rosuvastatin (CRESTOR) 10 MG tablet Take 10 mg by mouth daily.    Marland Kitchen triamterene-hydrochlorothiazide (MAXZIDE) 75-50 MG per tablet Take 1 tablet by mouth daily.      . cetirizine (ZYRTEC) 10 MG tablet Take 10 mg by mouth daily.   (Patient not taking: Reported on 03/19/2020)    . clobetasol cream (TEMOVATE) 0.05 % Apply 1 application topically as needed.  (Patient not taking: Reported on 03/19/2020)  0  . estradiol (ESTRACE) 1 MG tablet TK 1 T PO QD    . Olopatadine HCl 0.6 % SOLN Place 2 sprays into the nose 2 (two) times daily as needed. (Patient not taking: Reported on 03/19/2020) 30.5 g 5   No current facility-administered medications for this visit.     Known medication allergies: Allergies  Allergen Reactions  . Other     Steroid injections-headaches  . Prednisone Other (See Comments)    headache  . Sudafed [Pseudoephedrine Hcl]     Pt stated, "Makes me feel wired and keeps me up all night"  . Demerol Hives and  Rash    All over body  . Epinephrine Palpitations     Physical examination: Blood pressure 130/80, pulse (!) 55, temperature 98.1 F (36.7 C), resp. rate 18, height 5\' 5"  (1.651 m), weight 203 lb 6.4 oz (92.3 kg), SpO2 94 %.  General: Alert, interactive, in no acute distress. HEENT: PERRLA, TMs pearly gray, turbinates minimally edematous without discharge, post-pharynx non erythematous. Neck: Supple without lymphadenopathy. Lungs: Clear to auscultation without wheezing, rhonchi or rales. {no increased work of breathing. CV: Normal S1, S2 without murmurs. Abdomen: Nondistended, nontender. Skin: Warm and dry, without lesions or rashes. Extremities:  No clubbing, cyanosis or edema. Neuro:   Grossly  intact.  Diagnositics/Labs: Allergy testing: Environmental allergy skin testing is positive to blue, short and giant ragweed, lambs quarters, sheep Sorrell, beech, elm, hickory, pecan, black walnut, Fusarium, Mucor plumbeus, dog and horse. Allergy testing results were read and interpreted by provider, documented by clinical staff.   Assessment and plan:   Allergic rhinitis with history of recurrent sinusitis   Environmental allergy testing today is positive to grass pollen, weed pollen, tree pollen, mold, dog, and horse.  Continue allergen avoidance measures.    Flonase,1-2 sprays per nostril twice daily as needed for nasal congestion.  Use for 1-2 weeks at a time before stopping  Patanase (nasal antihistamine)  2 sprays twice a day for maintenance control of post-nasal drip  Ipratropium (Atrovent) 0.06% 2 sprays each nostril up to 3-4 times a day as needed for runny nose/post-nasal drip  Continue Nasal saline spray 1-2 times a day.  If able to tolerate consider nasal saline rinses  Recommend long-acting antihistamine either Allegra 180mg  or Xyzal 5mg  daily  If allergen avoidance measures and medications fail to relieve symptoms consider course of allergen immunotherapy (allergy shots)   Return in about 4-6 months or sooner if needed   I appreciate the opportunity to take part in Regina Daniels's care. Please do not hesitate to contact me with questions.  Sincerely,   [place], MD Allergy/Immunology [facility]

## 2020-03-19 NOTE — Patient Instructions (Signed)
Allergic rhinitis with history of recurrent sinusitis   Environmental allergy testing today is positive to grass pollen, weed pollen, tree pollen, mold, dog, and horse.  Continue allergen avoidance measures.    Flonase,1-2 sprays per nostril twice daily as needed for nasal congestion.  Use for 1-2 weeks at a time before stopping  Patanase (nasal antihistamine)  2 sprays twice a day for maintenance control of post-nasal drip  Ipratropium (Atrovent) 0.06% 2 sprays each nostril up to 3-4 times a day as needed for runny nose/post-nasal drip  Continue Nasal saline spray 1-2 times a day.  If able to tolerate consider nasal saline rinses  Recommend long-acting antihistamine either Allegra 180mg  or Xyzal 5mg  daily  If allergen avoidance measures and medications fail to relieve symptoms consider course of allergen immunotherapy (allergy shots)   Return in about 4-6 months or sooner if needed

## 2020-04-23 ENCOUNTER — Ambulatory Visit: Payer: 59 | Admitting: Allergy

## 2020-04-30 ENCOUNTER — Ambulatory Visit: Payer: 59 | Admitting: Allergy

## 2020-05-28 ENCOUNTER — Ambulatory Visit: Payer: 59 | Admitting: Allergy

## 2020-06-06 ENCOUNTER — Ambulatory Visit: Payer: 59 | Admitting: Allergy

## 2020-06-18 ENCOUNTER — Other Ambulatory Visit: Payer: Self-pay | Admitting: Obstetrics and Gynecology

## 2020-06-18 ENCOUNTER — Encounter: Payer: Self-pay | Admitting: Allergy

## 2020-06-18 ENCOUNTER — Ambulatory Visit: Payer: 59 | Admitting: Allergy

## 2020-06-18 ENCOUNTER — Other Ambulatory Visit: Payer: Self-pay

## 2020-06-18 VITALS — BP 118/84 | HR 61 | Temp 98.7°F | Resp 16

## 2020-06-18 DIAGNOSIS — J3089 Other allergic rhinitis: Secondary | ICD-10-CM | POA: Diagnosis not present

## 2020-06-18 DIAGNOSIS — R928 Other abnormal and inconclusive findings on diagnostic imaging of breast: Secondary | ICD-10-CM

## 2020-06-18 DIAGNOSIS — J018 Other acute sinusitis: Secondary | ICD-10-CM

## 2020-06-18 NOTE — Patient Instructions (Addendum)
Sinus infection  Remains symptomatic despite course of Augmentin and Levofloxacin  Recommend short prednisone burst at this time to help decrease inflammation in sinus/airway.  Take 20mg  daily for 3 days (advised if still symptoms after 2 days then increase to 20mg  twice a day until pack is complete for 5 day course)  Can continue use of Mucinex-D as needed for congestion/sinus pressure and thin mucus   Allergic rhinitis with history of recurrent sinusitis   Continue avoidance measures for grass pollen, weed pollen, tree pollen, mold, dog, and horse.    Flonase,1-2 sprays per nostril twice daily as needed for nasal congestion.  Use for 1-2 weeks at a time before stopping  Patanase (nasal antihistamine)  2 sprays twice a day for maintenance control of post-nasal drip  Ipratropium (Atrovent) 0.06% 2 sprays each nostril up to 3-4 times a day as needed for runny nose/post-nasal drip  Continue Nasal saline spray 1-2 times a day.  If able to tolerate consider nasal saline rinses  Recommend long-acting antihistamine either Allegra 180mg  or Xyzal 5mg  daily  If allergen avoidance measures and medications fail to relieve symptoms consider course of allergen immunotherapy (allergy shots)   Return in about 4-6 months or sooner if needed

## 2020-06-18 NOTE — Progress Notes (Signed)
Follow-up Note  RE: Regina Daniels MRN: [id number] DOB: 1955/11/24 Date of Office Visit: 06/18/2020   History of present illness: Regina Daniels is a 64 y.o. female presenting today for sick visit.  She was last seen in the office on 03/19/2020 by myself.  She states on 05/31/2020 she went to urgent care with symptoms of earache and feeling like she was underwater, headache, sinus pressure, cough, voice changes.  She states she had low-grade temp highest of around 99 at that time.  She did go to the pharmacy to have Covid testing done that was negative.  At the urgent care she was prescribed a 10-day course of Augmentin.  When those symptoms did not improve she called the urgent care and received a 5-day course of levofloxacin which she completed this Sunday.  She states she is still having some symptoms including runny nose and drainage and feeling underwater still.  States her ear pain is improved.  The sinus pressure is still there a bit.  She states she has been performing sinus rinses and using Mucinex D as well as her regular medications which include as needed Flonase, Patanase, as needed Atrovent and long-acting antihistamine Allegra or Xyzal.  Review of systems: Review of Systems  Constitutional: Positive for malaise/fatigue. Negative for fever.  HENT: Positive for congestion, ear pain, sinus pain and sore throat.   Eyes: Negative.   Respiratory: Negative.   Gastrointestinal: Negative.   Musculoskeletal: Negative for myalgias.  Skin: Negative.  Negative for rash.  Neurological: Positive for headaches.    All other systems negative unless noted above in HPI  Past medical/social/surgical/family history have been reviewed and are unchanged unless specifically indicated below.  No changes  Medication List: Current Outpatient Medications  Medication Sig Dispense Refill  . acyclovir ointment (ZOVIRAX) 5 % Apply topically See admin instructions.    Marland Kitchen atenolol (TENORMIN) 25 MG tablet  Take 25 mg by mouth daily.    . cetirizine (ZYRTEC) 10 MG tablet Take 10 mg by mouth daily.      . clindamycin (CLEOCIN T) 1 % lotion     . estradiol (ESTRACE) 1 MG tablet TK 1 T PO QD    . fexofenadine (ALLEGRA) 30 MG/5ML suspension Take 30 mg by mouth daily.    . fluticasone (FLONASE) 50 MCG/ACT nasal spray Place into the nose.    . ibuprofen (ADVIL) 800 MG tablet ibuprofen 800 mg tablet  TK 1 T PO TID    . ipratropium (ATROVENT) 0.06 % nasal spray USE 2 SPRAYS IN EACH NOSTRIL THREE TIMES DAILY 15 mL 0  . levothyroxine (SYNTHROID, LEVOTHROID) 50 MCG tablet Take 50 mcg by mouth daily before breakfast.    . meloxicam (MOBIC) 15 MG tablet Take 1 tablet (15 mg total) by mouth daily. 30 tablet 0  . pantoprazole (PROTONIX) 40 MG tablet Take 40 mg by mouth as needed.     . potassium chloride (KLOR-CON) 10 MEQ tablet potassium chloride ER 10 mEq tablet,extended release  TK 1 T PO D    . rosuvastatin (CRESTOR) 10 MG tablet Take 10 mg by mouth daily.    Marland Kitchen triamterene-hydrochlorothiazide (MAXZIDE) 75-50 MG per tablet Take 1 tablet by mouth daily.       No current facility-administered medications for this visit.     Known medication allergies: Allergies  Allergen Reactions  . Other     Steroid injections-headaches  . Prednisone Other (See Comments)    headache  . Sudafed [Pseudoephedrine Hcl]  Pt stated, "Makes me feel wired and keeps me up all night"  . Demerol Hives and Rash    All over body  . Epinephrine Palpitations     Physical examination: Blood pressure 118/84, pulse 61, temperature 98.7 F (37.1 C), temperature source Temporal, resp. rate 16, SpO2 99 %.  General: Alert, interactive, in no acute distress. HEENT: PERRLA, TMs pearly gray, turbinates moderately edematous with clear discharge, post-pharynx non erythematous. Neck: Supple without lymphadenopathy. Lungs: Clear to auscultation without wheezing, rhonchi or rales. {no increased work of breathing. CV: Normal S1, S2  without murmurs. Abdomen: Nondistended, nontender. Skin: Warm and dry, without lesions or rashes. Extremities:  No clubbing, cyanosis or edema. Neuro:   Grossly intact.  Diagnositics/Labs: None today  Assessment and plan: Acute sinusitis  Remains symptomatic despite course of Augmentin and Levofloxacin  Recommend short prednisone burst at this time to help decrease inflammation in sinus/airway.  Take 20mg  daily for 3 days (advised if still symptoms after 2 days then increase to 20mg  twice a day until pack is complete for 5 day course)  Can continue use of Mucinex-D as needed for congestion/sinus pressure and thin mucus   Allergic rhinitis with history of recurrent sinusitis   Continue avoidance measures for grass pollen, weed pollen, tree pollen, mold, dog, and horse.    Flonase,1-2 sprays per nostril twice daily as needed for nasal congestion.  Use for 1-2 weeks at a time before stopping  Patanase (nasal antihistamine)  2 sprays twice a day for maintenance control of post-nasal drip  Ipratropium (Atrovent) 0.06% 2 sprays each nostril up to 3-4 times a day as needed for runny nose/post-nasal drip  Continue Nasal saline spray 1-2 times a day.  If able to tolerate consider nasal saline rinses  Recommend long-acting antihistamine either Allegra 180mg  or Xyzal 5mg  daily  If allergen avoidance measures and medications fail to relieve symptoms consider course of allergen immunotherapy (allergy shots)   Return in about 4-6 months or sooner if needed   I appreciate the opportunity to take part in Aleah's care. Please do not hesitate to contact me with questions.  Sincerely,   , MD Allergy/Immunology [facility]

## 2020-06-28 ENCOUNTER — Other Ambulatory Visit: Payer: Self-pay

## 2020-06-28 ENCOUNTER — Ambulatory Visit
Admission: RE | Admit: 2020-06-28 | Discharge: 2020-06-28 | Disposition: A | Discharge status: Home / Self Care | Payer: 59 | Source: Ambulatory Visit | Attending: Obstetrics and Gynecology | Admitting: Obstetrics and Gynecology

## 2020-06-28 DIAGNOSIS — R928 Other abnormal and inconclusive findings on diagnostic imaging of breast: Secondary | ICD-10-CM

## 2020-07-02 ENCOUNTER — Other Ambulatory Visit: Payer: 59

## 2020-07-17 ENCOUNTER — Other Ambulatory Visit: Payer: 59

## 2020-07-17 DIAGNOSIS — Z20822 Contact with and (suspected) exposure to covid-19: Secondary | ICD-10-CM

## 2020-07-19 LAB — SARS-COV-2, NAA 2 DAY TAT

## 2020-07-19 LAB — NOVEL CORONAVIRUS, NAA: SARS-CoV-2, NAA: NOT DETECTED

## 2020-07-31 ENCOUNTER — Other Ambulatory Visit: Payer: 59

## 2020-08-14 ENCOUNTER — Other Ambulatory Visit: Payer: 59

## 2020-08-14 DIAGNOSIS — Z20822 Contact with and (suspected) exposure to covid-19: Secondary | ICD-10-CM

## 2020-08-15 LAB — NOVEL CORONAVIRUS, NAA: SARS-CoV-2, NAA: NOT DETECTED

## 2020-08-15 LAB — SARS-COV-2, NAA 2 DAY TAT

## 2020-11-05 ENCOUNTER — Telehealth: Payer: Self-pay | Admitting: Internal Medicine

## 2020-11-05 NOTE — Telephone Encounter (Signed)
Ok to see

## 2020-11-05 NOTE — Telephone Encounter (Signed)
Patient accompanied a relative to the office today Regina Daniels, DOB [birth year]). Patient stated that Dr. Tanya Nones said he would take her on as a patient. Please confirm.

## 2020-11-05 NOTE — Telephone Encounter (Signed)
Please advise 

## 2020-11-06 NOTE — Telephone Encounter (Signed)
PCP updated in Epic.     Thank you!

## 2020-11-17 ENCOUNTER — Telehealth: Payer: Self-pay | Admitting: Allergy

## 2020-11-17 NOTE — Telephone Encounter (Signed)
Pt called this morning requesting refill for Ipratropium and Olapatadine. Pt would like refill sent to [facility] on [address]. Pt has appointment scheduled for 5/18 with Thurston Hole.   Best contact number is [phone].  Please advise.

## 2020-11-24 MED ORDER — OLOPATADINE HCL 0.1 % OP SOLN
1.0000 [drp] | Freq: Two times a day (BID) | OPHTHALMIC | 5 refills | Status: DC
Start: 2020-11-24 — End: 2021-12-08

## 2020-11-24 NOTE — Telephone Encounter (Signed)
Called and spoke to patient to inform her that her eye drops were sent in. Patient verbalized understanding.

## 2020-11-24 NOTE — Telephone Encounter (Signed)
Patient called back stating only the Ipratropium was sent in.  Patient still needs the Olopatadine sent into the pharmacy per the last note below.  Please Advise  [facility] on [address].

## 2020-11-24 NOTE — Addendum Note (Signed)
Addended by: Robet Leu A on: 11/24/2020 02:49 PM   Modules accepted: Orders

## 2020-12-03 ENCOUNTER — Ambulatory Visit: Payer: 59 | Admitting: Family Medicine

## 2020-12-10 ENCOUNTER — Encounter: Payer: Self-pay | Admitting: Family Medicine

## 2020-12-10 ENCOUNTER — Ambulatory Visit: Payer: 59 | Admitting: Family Medicine

## 2020-12-10 ENCOUNTER — Other Ambulatory Visit: Payer: Self-pay

## 2020-12-10 VITALS — BP 120/78 | HR 50 | Temp 97.3°F | Resp 16 | Ht 65.0 in | Wt 199.0 lb

## 2020-12-10 DIAGNOSIS — J3089 Other allergic rhinitis: Secondary | ICD-10-CM | POA: Diagnosis not present

## 2020-12-10 DIAGNOSIS — H1013 Acute atopic conjunctivitis, bilateral: Secondary | ICD-10-CM

## 2020-12-10 DIAGNOSIS — J302 Other seasonal allergic rhinitis: Secondary | ICD-10-CM

## 2020-12-10 DIAGNOSIS — H1010 Acute atopic conjunctivitis, unspecified eye: Secondary | ICD-10-CM

## 2020-12-10 MED ORDER — OLOPATADINE HCL 0.6 % NA SOLN: NASAL | 5 refills | Status: DC

## 2020-12-10 MED ORDER — IPRATROPIUM BROMIDE 0.06 % NA SOLN: NASAL | 5 refills | Status: AC

## 2020-12-10 NOTE — Patient Instructions (Addendum)
Allergic rhinitis Continue allergen avoidance measures directed toward weed pollen, grass pollen, tree pollen, mold, dog, and horse as listed below Continue an over the counter antihistamine once a day as needed for a runny nose or itch. Remember to rotate to a different antihistamine about every 3 months. Some examples of over the counter antihistamines include Zyrtec (cetirizine), Xyzal (levocetirizine), Allegra (fexofenadine), and Claritin (loratidine).  Continue Flonase nasal spray 2 sprays each nostril once a day as needed for a stuffy nose.   In the right nostril, point the applicator out toward the right ear. In the left nostril, point the applicator out toward the left ear Continue Patanase 2 sprays in each nostril twice a day as needed for a runny nose Continue ipratropium nasal spray 2 sprays in each nostril up to three times a day as needed for runny nose.   Consider saline nasal rinses as needed for nasal symptoms. Use this before any medicated nasal sprays for best result Consider a course of immunotherapy if your symptoms are not well managed with medications as listed above  Allergic conjunctivitis Some over the counter eye drops include Pataday one drop in each eye once a day as needed for red, itchy eyes OR Zaditor one drop in each eye twice a day as needed for red itchy eyes.  Call the clinic if this treatment plan is not working well for you  Follow up in 1 year or sooner if needed.  Reducing Pollen Exposure The American Academy of Allergy, Asthma and Immunology suggests the following steps to reduce your exposure to pollen during allergy seasons. 1. Do not hang sheets or clothing out to dry; pollen may collect on these items. 2. Do not mow lawns or spend time around freshly cut grass; mowing stirs up pollen. 3. Keep windows closed at night.  Keep car windows closed while driving. 4. Minimize morning activities outdoors, a time when pollen counts are usually at their  highest. 5. Stay indoors as much as possible when pollen counts or humidity is high and on windy days when pollen tends to remain in the air longer. 6. Use air conditioning when possible.  Many air conditioners have filters that trap the pollen spores. 7. Use a HEPA room air filter to remove pollen form the indoor air you breathe.  Control of Mold Allergen Mold and fungi can grow on a variety of surfaces provided certain temperature and moisture conditions exist.  Outdoor molds grow on plants, decaying vegetation and soil.  The major outdoor mold, Alternaria and Cladosporium, are found in very high numbers during hot and dry conditions.  Generally, a late Summer - Fall peak is seen for common outdoor fungal spores.  Rain will temporarily lower outdoor mold spore count, but counts rise rapidly when the rainy period ends.  The most important indoor molds are Aspergillus and Penicillium.  Dark, humid and poorly ventilated basements are ideal sites for mold growth.  The next most common sites of mold growth are the bathroom and the kitchen.  Outdoor [employer] 8. Use air conditioning and keep windows closed 9. Avoid exposure to decaying vegetation. 10. Avoid leaf raking. 11. Avoid grain handling. 12. Consider wearing a face mask if working in moldy areas.  Indoor Mold Control 1. Maintain humidity below 50%. 2. Clean washable surfaces with 5% bleach solution. 3. Remove sources e.g. Contaminated carpets.  Control of Dog or Cat Allergen Avoidance is the best way to manage a dog or cat allergy. If you have a  dog or cat and are allergic to dog or cats, consider removing the dog or cat from the home. If you have a dog or cat but don't want to find it a new home, or if your family wants a pet even though someone in the household is allergic, here are some strategies that may help keep symptoms at bay:  13. Keep the pet out of your bedroom and restrict it to only a few rooms. Be advised that keeping  the dog or cat in only one room will not limit the allergens to that room. 14. Don't pet, hug or kiss the dog or cat; if you do, wash your hands with soap and water. 15. High-efficiency particulate air (HEPA) cleaners run continuously in a bedroom or living room can reduce allergen levels over time. 16. Regular use of a high-efficiency vacuum cleaner or a central vacuum can reduce allergen levels. 17. Giving your dog or cat a bath at least once a week can reduce airborne allergen.

## 2020-12-10 NOTE — Progress Notes (Signed)
[address] [name] [address] Dept: [phone]  FOLLOW UP NOTE  Patient ID: Regina Daniels, female    DOB: May 12, 1956  Age: 65 y.o. MRN: [id number] Date of Office Visit: 12/10/2020  Assessment  Chief Complaint: Allergic Rhinitis  (No issues. In need of refills )  HPI [name] is a 65 year old female who presents to the clinic for a follow-up visit.  She was last seen in this clinic on 06/18/2020 by Dr. Delorse Lek for evaluation of allergic rhinitis and allergic conjunctivitis. At today's visit, she reports her allergic rhinitis has been moderately well controlled with symptoms including clear rhinorrhea and occasional sneezing.  She denies nasal congestion or postnasal drainage.  She continues Flonase daily, Patanase nasal spray daily, and is recently out of of Atrovent nasal spray.  She continues to alternate between Zyrtec and Xyzal about once every 3 months with relief of symptoms.  She denies any sinus infections requiring antibiotics or prednisone since her last visit to this clinic.  Allergic conjunctivitis is reported as well controlled with occasional use of refresh eyedrops.  She is not currently using an antihistamine eyedrop.  Her current medications are listed in the chart.   Drug Allergies:  Allergies  Allergen Reactions  . Meperidine Hcl Hives  . Other     Steroid injections-headaches  . Prednisone Other (See Comments)    headache  . Sudafed [Pseudoephedrine Hcl]     Pt stated, "Makes me feel wired and keeps me up all night"  . Demerol Hives and Rash    All over body  . Epinephrine Palpitations and Other (See Comments)    Physical Exam: BP 120/78   Pulse (!) 50   Temp (!) 97.3 F (36.3 C)   Resp 16   Ht 5\' 5"  (1.651 m)   Wt 199 lb (90.3 kg)   SpO2 99%   BMI 33.12 kg/m    Physical Exam Vitals reviewed.  Constitutional:      Appearance: Normal appearance.  HENT:     Head: Normocephalic and atraumatic.     Right Ear: Tympanic membrane  normal.     Left Ear: Tympanic membrane normal.     Nose:     Comments: Bilateral nares slightly erythematous with clear nasal drainage noted.  Pharynx normal.  Ears normal.  Eyes normal.    Mouth/Throat:     Pharynx: Oropharynx is clear.  Eyes:     Conjunctiva/sclera: Conjunctivae normal.  Cardiovascular:     Rate and Rhythm: Normal rate and regular rhythm.     Heart sounds: Normal heart sounds. No murmur heard.   Pulmonary:     Effort: Pulmonary effort is normal.     Breath sounds: Normal breath sounds.     Comments: Lungs clear to auscultation Musculoskeletal:        General: Normal range of motion.     Cervical back: Normal range of motion and neck supple.  Skin:    General: Skin is warm and dry.  Neurological:     Mental Status: She is alert and oriented to person, place, and time.  Psychiatric:        Mood and Affect: Mood normal.        Behavior: Behavior normal.        Thought Content: Thought content normal.        Judgment: Judgment normal.     Assessment and Plan: 1. Seasonal and perennial allergic rhinitis   2. Seasonal allergic conjunctivitis  Meds ordered this encounter  Medications  . ipratropium (ATROVENT) 0.06 % nasal spray    Sig: Apply 1 to 2 sprays in each nostril twice a day as needed for runny nose.    Dispense:  15 mL    Refill:  5  . Olopatadine HCl 0.6 % SOLN    Sig: 2 sprays twice a day for maintenance control of post-nasal drip    Dispense:  30.5 g    Refill:  5    Patient Instructions  Allergic rhinitis Continue allergen avoidance measures directed toward weed pollen, grass pollen, tree pollen, mold, dog, and horse as listed below Continue an over the counter antihistamine once a day as needed for a runny nose or itch. Remember to rotate to a different antihistamine about every 3 months. Some examples of over the counter antihistamines include Zyrtec (cetirizine), Xyzal (levocetirizine), Allegra (fexofenadine), and Claritin  (loratidine).  Continue Flonase nasal spray 2 sprays each nostril once a day as needed for a stuffy nose.   In the right nostril, point the applicator out toward the right ear. In the left nostril, point the applicator out toward the left ear Continue Patanase 2 sprays in each nostril twice a day as needed for a runny nose Continue ipratropium nasal spray 2 sprays in each nostril up to three times a day as needed for runny nose.   Consider saline nasal rinses as needed for nasal symptoms. Use this before any medicated nasal sprays for best result Consider a course of immunotherapy if your symptoms are not well managed with medications as listed above  Allergic conjunctivitis Some over the counter eye drops include Pataday one drop in each eye once a day as needed for red, itchy eyes OR Zaditor one drop in each eye twice a day as needed for red itchy eyes.  Call the clinic if this treatment plan is not working well for you  Follow up in 1 year or sooner if needed.   Return in about 1 year (around 12/10/2021), or if symptoms worsen or fail to improve.    Thank you for the opportunity to care for this patient.  Please do not hesitate to contact me with questions.  Thermon Leyland, FNP [facility]

## 2021-03-06 ENCOUNTER — Ambulatory Visit: Payer: 59 | Admitting: Plastic Surgery

## 2021-03-06 ENCOUNTER — Other Ambulatory Visit: Payer: Self-pay

## 2021-03-06 ENCOUNTER — Encounter: Payer: Self-pay | Admitting: Plastic Surgery

## 2021-03-06 VITALS — BP 138/76 | HR 60 | Ht 65.5 in | Wt 193.0 lb

## 2021-03-06 DIAGNOSIS — L732 Hidradenitis suppurativa: Secondary | ICD-10-CM | POA: Diagnosis not present

## 2021-03-06 DIAGNOSIS — M793 Panniculitis, unspecified: Secondary | ICD-10-CM

## 2021-03-06 NOTE — Progress Notes (Signed)
Patient ID: Regina Daniels, female    DOB: 12-Dec-1955, 65 y.o.   MRN: [id number]   Chief Complaint  Patient presents with   consult    The patient is a 65 year old female here for evaluation of her abdomen.  She is 5 feet 5 inches tall and weighs 193 pounds.  She has had PT in the past for hip issues.  Its been several years.  She does not have diabetes and is not a smoker.  She is not on any blood thinners.  She does suffer from hidradenitis.  She has been on metformin for the hidradenitis which seems to keep it under control.  She had a couple of surgeries for the groin hidradenitis with Dr. Corliss Skains and that seems to have kept things stable.  She also has thyroid disease which is stable and hypertension.  She suffers from yeast infections and skin breakdown and her pannus area.  She has been really good about keeping her weight down and stable.  She is interested in getting the excess tissue removed for better hygiene.   Review of Systems  Constitutional: Negative.  Negative for activity change and appetite change.  Eyes: Negative.   Respiratory:  Negative for chest tightness and shortness of breath.   Cardiovascular:  Negative for leg swelling.  Gastrointestinal:  Negative for abdominal distention.  Endocrine: Negative.   Genitourinary: Negative.   Musculoskeletal:  Positive for back pain.  Skin:  Positive for color change and rash.  Psychiatric/Behavioral: Negative.     Past Medical History:  Diagnosis Date   Allergy    Anemia    Angio-edema    Anxiety    Arthritis    Chronic headaches    Sinus   Dysrhythmia    palpitations   GERD (gastroesophageal reflux disease)    Groin abscess    Hypertension    since age 11   Hypothyroidism    PONV (postoperative nausea and vomiting)    Recurrent upper respiratory infection (URI)    Sleep difficulties    Thyroid disease    hypothyroidism    Past Surgical History:  Procedure Laterality Date   ABDOMINAL HYSTERECTOMY  1993    BREAST SURGERY  1993 and 1994   Lt lumpectomy   INCISE AND DRAIN ABCESS     abscess left groin   INGUINAL HIDRADENITIS EXCISION  2012   bil   SHOULDER ACROMIOPLASTY Left 04/09/2015   Procedure: SHOULDER ACROMIOPLASTY;  Surgeon: Frederico Hamman, MD;  Location: [facility];  Service: Orthopedics;  Laterality: Left;   SHOULDER ARTHROSCOPY WITH OPEN ROTATOR CUFF REPAIR Left 04/09/2015   Procedure: LEFT SHOULDER ARTHROSCOPY DEBRIDEMENT ,  with open acromioplasty and rotator cuff repair;  Surgeon: Frederico Hamman, MD;  Location: [facility];  Service: Orthopedics;  Laterality: Left;   SINOSCOPY     TEMPOROMANDIBULAR JOINT SURGERY  2002   TUBAL LIGATION        Current Outpatient Medications:    acyclovir ointment (ZOVIRAX) 5 %, Apply topically See admin instructions., Disp: , Rfl:    atenolol (TENORMIN) 25 MG tablet, Take 25 mg by mouth daily., Disp: , Rfl:    cetirizine (ZYRTEC) 10 MG tablet, Take 10 mg by mouth daily.  , Disp: , Rfl:    clindamycin (CLEOCIN T) 1 % external solution, Apply topically daily., Disp: , Rfl:    clindamycin (CLEOCIN T) 1 % lotion, , Disp: , Rfl:    estradiol (ESTRACE) 1 MG tablet, TK 1  T PO QD, Disp: , Rfl:    fexofenadine (ALLEGRA) 30 MG/5ML suspension, Take 30 mg by mouth daily., Disp: , Rfl:    fluticasone (FLONASE) 50 MCG/ACT nasal spray, Place into the nose., Disp: , Rfl:    ibuprofen (ADVIL) 800 MG tablet, ibuprofen 800 mg tablet  TK 1 T PO TID, Disp: , Rfl:    ipratropium (ATROVENT) 0.06 % nasal spray, Apply 1 to 2 sprays in each nostril twice a day as needed for runny nose., Disp: 15 mL, Rfl: 5   levothyroxine (SYNTHROID, LEVOTHROID) 50 MCG tablet, Take 50 mcg by mouth daily before breakfast., Disp: , Rfl:    meloxicam (MOBIC) 15 MG tablet, Take 1 tablet (15 mg total) by mouth daily., Disp: 30 tablet, Rfl: 0   metFORMIN (GLUCOPHAGE-XR) 750 MG 24 hr tablet, Take 750 mg by mouth daily., Disp: , Rfl:    olopatadine (PATANOL) 0.1 %  ophthalmic solution, Place 1 drop into both eyes 2 (two) times daily., Disp: 5 mL, Rfl: 5   Olopatadine HCl 0.6 % SOLN, 2 sprays twice a day for maintenance control of post-nasal drip, Disp: 30.5 g, Rfl: 5   pantoprazole (PROTONIX) 40 MG tablet, Take 40 mg by mouth as needed. , Disp: , Rfl:    potassium chloride (KLOR-CON) 10 MEQ tablet, potassium chloride ER 10 mEq tablet,extended release  TK 1 T PO D, Disp: , Rfl:    rosuvastatin (CRESTOR) 10 MG tablet, Take 10 mg by mouth daily., Disp: , Rfl:    triamterene-hydrochlorothiazide (MAXZIDE) 75-50 MG per tablet, Take 1 tablet by mouth daily., Disp: , Rfl:    Objective:   Vitals:   03/06/21 0934  BP: 138/76  Pulse: 60  SpO2: 98%    Physical Exam Vitals and nursing note reviewed.  Constitutional:      Appearance: Normal appearance.  HENT:     Head: Normocephalic and atraumatic.  Cardiovascular:     Rate and Rhythm: Normal rate.     Pulses: Normal pulses.  Pulmonary:     Effort: Pulmonary effort is normal.  Abdominal:     General: Abdomen is flat. There is no distension.     Tenderness: There is no abdominal tenderness. There is no guarding.  Musculoskeletal:        General: No swelling.  Skin:    General: Skin is warm.     Capillary Refill: Capillary refill takes less than 2 seconds.     Coloration: Skin is not jaundiced.     Findings: No bruising.  Neurological:     General: No focal deficit present.     Mental Status: She is alert and oriented to person, place, and time.  Psychiatric:        Mood and Affect: Mood normal.        Behavior: Behavior normal.    Assessment & Plan:  Hidradenitis  Panniculitis  The procedure the patient selected and that was best for the patient was discussed. The risk were discussed and include but not limited to the following:  fluid accumulation, skin loss, change in skin, bleeding, infection and healing delay.  There are risks of anesthesia and injury to nerves or blood vessels.  Allergic  reaction to tape, suture and skin glue are possible.  There will be swelling.  Any of these can lead to the need for revisional surgery.    Total time: 45 minutes. This includes time spent with the patient during the visit as well as time spent before and after  the visit reviewing the chart, documenting the encounter and ordering pertinent studies. and literature emailed to the patient.   Physical therapy:  ordered  Pictures were obtained of the patient and placed in the chart with the patient's or guardian's permission. The patient is a good candidate for a panniculectomy.  We will get her into physical therapy and then talk to her more.  I think it would also help her hidradenitis.Alena Bills Lavilla Delamora, DO

## 2021-03-18 ENCOUNTER — Other Ambulatory Visit: Payer: Self-pay

## 2021-03-18 ENCOUNTER — Ambulatory Visit: Payer: 59 | Attending: Plastic Surgery | Admitting: Physical Therapy

## 2021-03-18 ENCOUNTER — Encounter: Payer: Self-pay | Admitting: Physical Therapy

## 2021-03-18 DIAGNOSIS — M5442 Lumbago with sciatica, left side: Secondary | ICD-10-CM

## 2021-03-18 DIAGNOSIS — M6281 Muscle weakness (generalized): Secondary | ICD-10-CM

## 2021-03-18 NOTE — Therapy (Signed)
[facility] [address] [place], [place], [address] Phone: [phone]   Fax:  [phone]  Physical Therapy Treatment  Patient Details  Name: Regina BalmDebra C Cirillo MRN: [id number] Date of Birth: 05/24/1956 Referring Provider (PT): Foster Simpsonlaire Dillingham, DO   Encounter Date: 03/18/2021   PT End of Session - 03/18/21 1314     Visit Number 1    Number of Visits 6    Date for PT Re-Evaluation 04/29/21    Authorization Type UHC    PT Start Time 1314    PT Stop Time 1400    PT Time Calculation (min) 46 min    Activity Tolerance Patient tolerated treatment well    Behavior [facility] tasks assessed/performed             Past Medical History:  Diagnosis Date   Allergy    Anemia    Angio-edema    Anxiety    Arthritis    Chronic headaches    Sinus   Dysrhythmia    palpitations   GERD (gastroesophageal reflux disease)    Groin abscess    Hypertension    since age 65   Hypothyroidism    PONV (postoperative nausea and vomiting)    Recurrent upper respiratory infection (URI)    Sleep difficulties    Thyroid disease    hypothyroidism    Past Surgical History:  Procedure Laterality Date   ABDOMINAL HYSTERECTOMY  1993   BREAST SURGERY  1993 and 1994   Lt lumpectomy   INCISE AND DRAIN ABCESS     abscess left groin   INGUINAL HIDRADENITIS EXCISION  2012   bil   SHOULDER ACROMIOPLASTY Left 04/09/2015   Procedure: SHOULDER ACROMIOPLASTY;  Surgeon: Frederico Hammananiel Caffrey, MD;  Location: [facility];  Service: Orthopedics;  Laterality: Left;   SHOULDER ARTHROSCOPY WITH OPEN ROTATOR CUFF REPAIR Left 04/09/2015   Procedure: LEFT SHOULDER ARTHROSCOPY DEBRIDEMENT ,  with open acromioplasty and rotator cuff repair;  Surgeon: Frederico Hammananiel Caffrey, MD;  [facility];  Service: Orthopedics;  Laterality: Left;   SINOSCOPY     TEMPOROMANDIBULAR JOINT SURGERY  2002   TUBAL LIGATION      There were no  vitals filed for this visit.   Subjective Assessment - 03/18/21 1311     Subjective Patient is here as a referred patient from Dr. Ulice Boldillingham who recommended PT prior to her surgery.  She has back pain which is increased with standing, walking. She has difficulty with standing in the kitchen, walking 1/4 mile and had back pain. Pain radiates from back to her L hip and at times into her leg.  She has had PT before for her L shoulder and L hip. LLE does not feel weak per se.  She had COVID a few weeks ago and is still not 100% herself.  She tires easily.    Pertinent History L shoulder surgery, L hip pain    Limitations Standing;Walking;House hold activities;Lifting;Other (comment)   limits housework, sleep at time (hip)   Diagnostic tests none    Patient Stated Goals Patient would like less pain    Currently in Pain? Yes    Pain Score 2    currently 0 this am 2/10.   Pain Location Back    Pain Orientation Lower;Left    Pain Descriptors / Indicators Aching;Sore    Pain Type Chronic pain    Pain Radiating Towards L hip    Pain Onset More than  a month ago    Pain Frequency Several days a week    Aggravating Factors  sleep on L side, standing , walking    Pain Relieving Factors rest, sitting down, Mobic prn    Effect of Pain on Daily Activities limits her activity    Multiple Pain Sites No                [facility] PT Assessment - 03/18/21 0001       Assessment   Medical Diagnosis back pain with Panniculitis    Referring Provider (PT) Foster Simpson, DO    Onset Date/Surgical Date --   years   Prior Therapy Yes      Precautions   Precautions None      Restrictions   Weight Bearing Restrictions No      Balance Screen   Has the patient fallen in the past 6 months No      Home Environment   Living Environment Private residence    Living Arrangements Other (Comment)    Type of Home House    Home Access Stairs to enter    Entrance Stairs-Number of Steps 3    Additional  Comments lives with grandson 15 yrs      Prior Function   Level of Independence Independent    Vocation Retired    Building surveyor and gamble    Leisure exercise class, like to be in the yard      Cognition   Overall Cognitive Status Within Functional Limits for tasks assessed      Observation/Other Assessments   Focus on Therapeutic Outcomes (FOTO)  NT due to dx      Sensation   Light Touch Appears Intact      Posture/Postural Control   Posture/Postural Control Postural limitations    Postural Limitations Rounded Shoulders;Forward head;Left pelvic obliquity    Posture Comments high L hip      AROM   Lumbar Flexion WFL    Lumbar Extension WFL pain in L hip    Lumbar - Right Side Bend pain on L    Lumbar - Left Side Bend pain on L    Lumbar - Right Rotation 25%    Lumbar - Left Rotation 25%      Strength   Overall Strength Comments WNL in knees    Right Hip Flexion 3+/5    Right Hip ABduction 4-/5    Right Hip ADduction 3/5    Left Hip Flexion 3+/5    Left Hip ABduction 4-/5    Left Hip ADduction 3/5      Palpation   Spinal mobility WNL    Palpation comment pain, soreness central LS spine and to L Quadratus lumborum,sore in L piriformis      Special Tests   Other special tests neg SLR, normal hip PROM               [facility] Adult PT Treatment/Exercise - 03/18/21 0001       Lumbar Exercises: Stretches   Piriformis Stretch 2 reps    Piriformis Stretch Limitations 30 sec , knee pull over    Other Lumbar Stretch Exercise sidelying QL , L hip stretching      Lumbar Exercises: Supine   Pelvic Tilt 10 reps    Bridge 10 reps      Lumbar Exercises: Sidelying   Hip Abduction 10 reps  PT Education - 03/18/21 1314     Education Details PT/POC, HEP and imaging, DDD, nerve irritation    Person(s) Educated Patient    Methods Explanation    Comprehension Verbalized understanding                 PT Long Term  Goals - 03/18/21 1421       PT LONG TERM GOAL #1   Title Pt will be I with HEP for core, hips    Time 6    Period Weeks    Status New    Target Date 04/29/21      PT LONG TERM GOAL #2   Title Pt will be able to note longer periods of standing for light housework  with pain < 2/10 most of the time    Time 6    Period Weeks    Status New    Target Date 04/29/21      PT LONG TERM GOAL #3   Title Pt will be able to lift, squat without increased back pain for moderate sized items ( upt o 20 lbs)    Time 6    Period Weeks    Status New    Target Date 04/29/21      PT LONG TERM GOAL #4   Title Pt will be able to 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  She has had this pain for years.  She is hoping that PT will help her reduce the pain she feels with standing, ADLs and walking.  She is considering removal of excess tissue in her abdomen and therapy may give her temporary relief.  She has significant weakness in hips, core and this definitely contributes to her back pain, unclear to me if the panniculits plays a role in this chronic issue.    Personal Factors and Comorbidities Comorbidity 2;Time since onset of injury/illness/exacerbation    Comorbidities L shoulder pain, L hip pain, recent COVID    Examination-Activity Limitations Bed Mobility;Sit;Transfers;Sleep;Lift;Bend;Squat;Locomotion Level    Examination-Participation Restrictions Interpersonal Relationship;Yard Work;Community Activity;Meal Prep;Cleaning    Stability/Clinical Decision Making Stable/Uncomplicated    Clinical Decision Making Low    Rehab Potential Excellent    PT Frequency 1x / week    PT Duration 6 weeks    PT  Treatment/Interventions ADLs/Self Care Home Management;Moist Heat;Therapeutic activities;Functional mobility training;Electrical Stimulation;Cryotherapy;Therapeutic exercise;Patient/family education;Manual techniques;Dry needling    PT Next Visit Plan check HEP manual to L lumbar , L hip . begin hinging and standing core    PT Home Exercise Plan Access Code [email or web address]    Consulted and Agree with Plan of Care Patient             Patient will benefit from skilled therapeutic intervention in order to improve the following deficits and impairments:  Decreased endurance, Decreased mobility, Pain, Obesity, Postural dysfunction, Impaired UE functional use, Increased fascial restricitons, Impaired flexibility, Decreased strength  Visit Diagnosis: Midline low back pain with left-sided sciatica, unspecified chronicity  Muscle weakness (generalized)     Problem List Patient Active  Problem List   Diagnosis Date Noted   Panniculitis 03/06/2021   Urinary tract infectious disease 08/07/2018   Menorrhagia 08/07/2018   Dysuria 08/07/2018   Acute pansinusitis 08/07/2018   Cough, persistent 07/01/2017   Allergic rhinitis 12/09/2015   Hidradenitis 03/25/2011    Shawntel Farnworth 03/18/2021, 2:31 PM  [facility] [address] [place], [place], [address] Phone: [phone]   Fax:  [phone]  Name: VAUNDA GUTTERMAN MRN: [id number] Date of Birth: 25-Feb-1956   [name], PT 03/18/21 2:32 PM Phone: [phone] Fax: [phone]

## 2021-03-24 ENCOUNTER — Ambulatory Visit: Payer: 59 | Admitting: Physical Therapy

## 2021-03-26 ENCOUNTER — Encounter: Payer: Self-pay | Admitting: Physical Therapy

## 2021-03-26 ENCOUNTER — Ambulatory Visit: Payer: 59 | Attending: Plastic Surgery | Admitting: Physical Therapy

## 2021-03-26 ENCOUNTER — Other Ambulatory Visit: Payer: Self-pay

## 2021-03-26 DIAGNOSIS — M6281 Muscle weakness (generalized): Secondary | ICD-10-CM

## 2021-03-26 DIAGNOSIS — M5442 Lumbago with sciatica, left side: Secondary | ICD-10-CM | POA: Diagnosis present

## 2021-03-26 NOTE — Therapy (Signed)
[place], [place], [address] Phone: [phone]   Fax:  [phone]  Physical Therapy Treatment  Patient Details  Name: TALYN EDDIE MRN: [id number] Date of Birth: 01/09/1956 Referring Provider (PT): Audelia Hives, DO   Encounter Date: 03/26/2021   PT End of Session - 03/26/21 6384     Visit Number 64    Number of Visits 6    Date for PT Re-Evaluation 04/29/21    Authorization Type UHC    PT Start Time 0934    PT Stop Time 1015    PT Time Calculation (min) 41 min    Activity Tolerance Patient tolerated treatment well    Behavior During Therapy [facility] for tasks assessed/performed             Past Medical History:  Diagnosis Date   Allergy    Anemia    Angio-edema    Anxiety    Arthritis    Chronic headaches    Sinus   Dysrhythmia    palpitations   GERD (gastroesophageal reflux disease)    Groin abscess    Hypertension    since age 65   Hypothyroidism    PONV (postoperative nausea and vomiting)    Recurrent upper respiratory infection (URI)    Sleep difficulties    Thyroid disease    hypothyroidism    Past Surgical History:  Procedure Laterality Date   [place] lumpectomy   INCISE AND DRAIN ABCESS     abscess left groin   INGUINAL HIDRADENITIS EXCISION  2012   bil   SHOULDER ACROMIOPLASTY Left 04/09/2015   Procedure: SHOULDER ACROMIOPLASTY;  Surgeon: Earlie Server, MD;  Location: [place];  Service: Orthopedics;  Laterality: Left;   SHOULDER ARTHROSCOPY WITH OPEN ROTATOR CUFF REPAIR Left 04/09/2015   Procedure: LEFT SHOULDER ARTHROSCOPY DEBRIDEMENT ,  with open acromioplasty and rotator cuff repair;  Surgeon: Earlie Server, MD;  Location: [place];  Service: Orthopedics;  Laterality: Left;   SINOSCOPY     TEMPOROMANDIBULAR JOINT SURGERY  2002   TUBAL LIGATION      There were no  vitals filed for this visit.   Subjective Assessment - 03/26/21 0939     Subjective " I am doing about the same since the last session.    Currently in Pain? Yes    Pain Score 0-No pain    Pain Orientation Left    Pain Onset More than a month ago    Pain Frequency Intermittent    Aggravating Factors  standing for any period of time, Walking for a period of time, on uneven surfac                [facility] PT Assessment - 03/26/21 0001       Assessment   Medical Diagnosis back pain with Panniculitis    Referring Provider (PT) Audelia Hives, DO                           [facility] [address] [facility] [address] Adult PT Treatment/Exercise - 03/26/21 0001       Lumbar Exercises: Stretches   Lower Trunk Rotation --   1 x 10   Prone on Elbows Stretch Limitations 2 x 10      Lumbar Exercises: Supine   Pelvic Tilt 5 reps;5 seconds    Bent Knee Raise 10 reps  lifiting RLE and lowering the LLE   Bridge 10 reps   x 2 sets combined with glute set   Other Supine Lumbar Exercises isometric adductor balls squeeze      Manual Therapy   Manual therapy comments MTPR along the glut med  on the R                     PT Education - 03/26/21 1013     Education Details Time taken to review HEp and her imaging report that she brought to the session.    Person(s) Educated Patient    Methods Explanation;Verbal cues    Comprehension Verbalized understanding                 PT Long Term Goals - 03/18/21 1421       PT LONG TERM GOAL #1   Title Pt will be I with HEP for core, hips    Time 6    Period Weeks    Status New    Target Date 04/29/21      PT LONG TERM GOAL #2   Title Pt will be able to note longer periods of standing for light housework  with pain < 2/10 most of the time    Time 6    Period Weeks    Status New    Target Date 04/29/21      PT LONG TERM GOAL #3   Title Pt will be able to lift, squat without increased back pain for moderate sized items ( upt o 20 lbs)     Time 6    Period Weeks    Status New    Target Date 04/29/21      PT LONG TERM GOAL #4   Title Pt will be able to 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 time was taken to review her imaging results, and trialed extention biase with prone on elbows. Worked on core strengthening and STW along the R glute med due to pt report of increaesd pain located at the R SIJ with walking/ standing. Trialed adductor MET which she noted decreaesd tension in the R with standing/ walking.    PT Treatment/Interventions ADLs/Self Care Home Management;Moist Heat;Therapeutic activities;Functional mobility training;Electrical Stimulation;Cryotherapy;Therapeutic exercise;Patient/family education;Manual techniques;Dry needling    PT Next Visit Plan check HEP manual to L lumbar , L hip . begin hinging and standing core, potential SIJ involvement/ inflare on the R? response to MTPR along the glute med/ adductor activaiton, response to prone on elbows.    PT Home Exercise Plan Access Code [facility]    Consulted and Agree with Plan of Care Patient             Patient will benefit from skilled therapeutic intervention in order to improve the following deficits and impairments:  Decreased endurance, Decreased mobility, Pain, Obesity, Postural dysfunction, Impaired UE functional use, Increased fascial restricitons, Impaired flexibility, Decreased strength  Visit Diagnosis: Midline low back pain with left-sided sciatica, unspecified chronicity  Muscle weakness (  generalized)     Problem List Patient Active Problem List   Diagnosis Date Noted   Panniculitis 03/06/2021   Urinary tract  infectious disease 08/07/2018   Menorrhagia 08/07/2018   Dysuria 08/07/2018   Acute pansinusitis 08/07/2018   Cough, persistent 07/01/2017   Allergic rhinitis 12/09/2015   Hidradenitis 03/25/2011   Starr Lake PT, DPT, LAT, ATC  03/26/21  10:23 AM      [facility] [address] [place], [place], [address] Phone: [phone]   Fax:  [phone]  Name: MARSI TURVEY MRN: [id number] Date of Birth: October 10, 1955

## 2021-04-02 ENCOUNTER — Ambulatory Visit: Payer: 59 | Admitting: Physical Therapy

## 2021-04-02 ENCOUNTER — Other Ambulatory Visit: Payer: Self-pay

## 2021-04-02 DIAGNOSIS — M6281 Muscle weakness (generalized): Secondary | ICD-10-CM

## 2021-04-02 DIAGNOSIS — M5442 Lumbago with sciatica, left side: Secondary | ICD-10-CM | POA: Diagnosis not present

## 2021-04-02 NOTE — Therapy (Signed)
[facility] Outpatient Rehabilitation [facility] [address] [place], [place], [address] Phone: [phone]   Fax:  [phone]  Physical Therapy Treatment  Patient Details  Name: Regina Daniels MRN: [id number] Date of Birth: January 28, 1956 Referring Provider (PT): Foster Simpson, DO   Encounter Date: 04/02/2021   PT End of Session - 04/02/21 1027     Visit Number 3    Number of Visits 6    Date for PT Re-Evaluation 04/29/21    Authorization Type UHC    PT Start Time 1017    PT Stop Time 1059    PT Time Calculation (min) 42 min    Activity Tolerance Patient tolerated treatment well    Behavior During Therapy WFL for tasks assessed/performed             Past Medical History:  Diagnosis Date   Allergy    Anemia    Angio-edema    Anxiety    Arthritis    Chronic headaches    Sinus   Dysrhythmia    palpitations   GERD (gastroesophageal reflux disease)    Groin abscess    Hypertension    since age 63   Hypothyroidism    PONV (postoperative nausea and vomiting)    Recurrent upper respiratory infection (URI)    Sleep difficulties    Thyroid disease    hypothyroidism    Past Surgical History:  Procedure Laterality Date   ABDOMINAL HYSTERECTOMY  1993   BREAST SURGERY  1993 and 1994   Lt lumpectomy   INCISE AND DRAIN ABCESS     abscess left groin   INGUINAL HIDRADENITIS EXCISION  2012   bil   SHOULDER ACROMIOPLASTY Left 04/09/2015   Procedure: SHOULDER ACROMIOPLASTY;  Surgeon: Frederico Hamman, MD;  Location: [facility];  Service: Orthopedics;  Laterality: Left;   SHOULDER ARTHROSCOPY WITH OPEN ROTATOR CUFF REPAIR Left 04/09/2015   Procedure: LEFT SHOULDER ARTHROSCOPY DEBRIDEMENT ,  with open acromioplasty and rotator cuff repair;  Surgeon: Frederico Hamman, MD;  Location: [facility];  Service: Orthopedics;  Laterality: Left;   SINOSCOPY     TEMPOROMANDIBULAR JOINT SURGERY  2002   TUBAL LIGATION      There were no  vitals filed for this visit.   Subjective Assessment - 04/02/21 1020     Subjective I have pain in my hips , soreness about equal, none in hips right now .  No back pain currently.  Brought in her MRI. Has been doing her exercises HEP.  Still dealing with ENT headaches/earaches (post COVID) so I havent been waling or going to the gym.    Diagnostic tests MRI 06/2020: L4-L5 Degenerative disc, moderate spinal canal stenosis, mild R mod L foraminal stenosis, mod facet degenerative L5-S1    Currently in Pain? No/denies                 [facility] Adult PT Treatment/Exercise:  Therapeutic Exercise: - Nustep L4 UE and LE for 7 min (mod for knee pain )  Supine Posterior Pelvic Tilt - 2 sets - 10 reps - 5 hold, 1 sert with ball then 2nd set with more abdominal focus   Pilates Bridge -  1 sets - 15 reps - ball squeeze  Windshield wiper x 5  Supine core with ball under sacrum   Bent knee lift (alternating)  Knee press (knee and hip ext)   Clam unilat and bilateral no band x 10 each   Sidelying Hip Abduction - 2  sets - 10 reps - 5 hold  Clam with blue band x 2 x 10 , given green band for home  Supine and seated Piriformis Stretch with Foot on Ground - 3 x 30 sec    Verbal demo of QL stretch not done due to pain in hip on hard mat table          PT Long Term Goals - 04/02/21 1047       PT LONG TERM GOAL #1   Title Pt will be I with HEP for core, hips    Status On-going      PT LONG TERM GOAL #2   Title Pt will be able to note longer periods of standing for light housework  with pain < 2/10 most of the time    Status On-going      PT LONG TERM GOAL #3   Title Pt will be able to lift, squat without increased back pain for moderate sized items ( upt o 20 lbs)    Status On-going      PT LONG TERM GOAL #4   Title Pt will be able to 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  Did not perform soft tissue work as she had no pain per se.  Emphasizedcore work and hip strength on the mat, and offered her a more functional way to stretch her hip in sitting.  Cont POC.    PT Treatment/Interventions ADLs/Self Care Home Management;Moist Heat;Therapeutic activities;Functional mobility training;Electrical Stimulation;Cryotherapy;Therapeutic exercise;Patient/family education;Manual techniques;Dry needling    PT Next Visit Plan check HEP manual to L lumbar , L hip if needed.  begin hinging and standing core, potential SIJ involvement/ inflare on the R? response to MTPR along the glute med/ adductor activaiton    PT Home Exercise Plan Access Code [facility]    Consulted and Agree with Plan of Care Patient             Patient will benefit from skilled therapeutic intervention in order to improve the following deficits and impairments:  Decreased endurance, Decreased mobility, Pain, Obesity, Postural dysfunction, Impaired UE functional use, Increased fascial restricitons, Impaired flexibility, Decreased strength  Visit Diagnosis: Midline low back pain with left-sided sciatica, unspecified chronicity  Muscle weakness (generalized)     Problem List Patient Active Problem List   Diagnosis Date Noted   Panniculitis 03/06/2021   Urinary tract infectious disease 08/07/2018   Menorrhagia 08/07/2018   Dysuria 08/07/2018   Acute pansinusitis 08/07/2018   Cough, persistent 07/01/2017   Allergic rhinitis 12/09/2015   Hidradenitis 03/25/2011    Maiko Salais, PT 04/02/2021, 12:14 PM  [facility] Outpatient Rehabilitation [facility] [address] [place], [place], [address] Phone: [phone]   Fax:  [phone]  Name: Regina Daniels MRN: [id number] Date of Birth:  07/05/1956  [name], PT 04/02/21 12:14 PM Phone: [phone] Fax: [phone]

## 2021-04-09 ENCOUNTER — Ambulatory Visit: Payer: 59 | Admitting: Physical Therapy

## 2021-04-16 ENCOUNTER — Encounter: Payer: Self-pay | Admitting: Physical Therapy

## 2021-04-16 ENCOUNTER — Other Ambulatory Visit: Payer: Self-pay

## 2021-04-16 ENCOUNTER — Ambulatory Visit: Payer: 59 | Admitting: Physical Therapy

## 2021-04-16 DIAGNOSIS — M5442 Lumbago with sciatica, left side: Secondary | ICD-10-CM

## 2021-04-16 DIAGNOSIS — M6281 Muscle weakness (generalized): Secondary | ICD-10-CM

## 2021-04-16 NOTE — Therapy (Signed)
[facility] Outpatient Rehabilitation [employer] [address] [place], [place], [address] Phone: [phone]   Fax:  [phone]  Physical Therapy Treatment  Patient Details  Name: Regina Daniels MRN: [id number] Date of Birth: Apr 24, 1956 Referring Provider (PT): Foster Simpson, DO   Encounter Date: 04/16/2021   PT End of Session - 04/16/21 0942     Visit Number 4    Number of Visits 6    Date for PT Re-Evaluation 04/29/21    Authorization Type UHC    PT Start Time 0930    PT Stop Time 1020    PT Time Calculation (min) 50 min    Activity Tolerance Patient tolerated treatment well    Behavior During Therapy [facility] for tasks assessed/performed             Past Medical History:  Diagnosis Date   Allergy    Anemia    Angio-edema    Anxiety    Arthritis    Chronic headaches    Sinus   Dysrhythmia    palpitations   GERD (gastroesophageal reflux disease)    Groin abscess    Hypertension    since age 68   Hypothyroidism    PONV (postoperative nausea and vomiting)    Recurrent upper respiratory infection (URI)    Sleep difficulties    Thyroid disease    hypothyroidism    Past Surgical History:  Procedure Laterality Date   ABDOMINAL HYSTERECTOMY  1993   BREAST SURGERY  1993 and 1994   Lt lumpectomy   INCISE AND DRAIN ABCESS     abscess left groin   INGUINAL HIDRADENITIS EXCISION  2012   bil   SHOULDER ACROMIOPLASTY Left 04/09/2015   Procedure: SHOULDER ACROMIOPLASTY;  Surgeon: Frederico Hamman, MD;  Location: [facility];  Service: Orthopedics;  Laterality: Left;   SHOULDER ARTHROSCOPY WITH OPEN ROTATOR CUFF REPAIR Left 04/09/2015   Procedure: LEFT SHOULDER ARTHROSCOPY DEBRIDEMENT ,  with open acromioplasty and rotator cuff repair;  Surgeon: Frederico Hamman, MD;  Location: [facility];  Service: Orthopedics;  Laterality: Left;   SINOSCOPY     TEMPOROMANDIBULAR JOINT SURGERY  2002   TUBAL LIGATION      There were no  vitals filed for this visit.   Subjective Assessment - 04/16/21 0934     Subjective My cousin is staying with me and so I am busy taking care of her.  I am not hurting now.  I havent done my HEP as much as I would if she wasnt in my house.  I have been very busy though.  I had pain in my back with sweeping my house.    Currently in Pain? No/denies                [facility] PT Assessment - 04/16/21 0001       AROM   Lumbar Flexion WNL    Lumbar Extension WNL    Lumbar - Right Side Bend WNL   min pain on R   Lumbar - Left Side Bend WNL    Lumbar - Right Rotation WNL    Lumbar - Left Rotation WNL              Skilled therapy interventions:   Therapeutic Exercise: Recumbant bike 5 min L2 for warm up Seated piriformis stretch 30 sec x 2 each   Sit to stand green band x 15 on thighs   Bridging green band x 15 knee wide   Sidelying  clam green band x 15    Standing core    Spring board press down with roll down bar  x 10     Single sidefacing x 8    Standing arms with slastix bands  extension alternating x 10 each UE     Row in partial squat x 10 (alternating)     Diagonal pull 1 arm x 10    Palloff press x 10 add rotation x 10   Self care/Pt. Education:    Estate manager/land agent   Core and anti-rotation for stability ,principles             PT Long Term Goals - 04/02/21 1047       PT LONG TERM GOAL #1   Title Pt will be I with HEP for core, hips    Status On-going      PT LONG TERM GOAL #2   Title Pt will be able to note longer periods of standing for light housework  with pain < 2/10 most of the time    Status On-going      PT LONG TERM GOAL #3   Title Pt will be able to lift, squat without increased back pain for moderate sized items ( upt o 20 lbs)    Status On-going      PT LONG TERM GOAL #4   Title Pt will be able to resume her fitness class at the senior center without having to modify for back pain or excessive fatigue    Baseline has not done still  having fatigue and dyspnea    Status On-going                   Plan - 04/16/21 0943     Clinical Impression Statement Patient tolerates exercise well, emphasizing core to maximize tolerance for standing and functional activity.  She was tired but had no increased pain .  Given info for body mechanics and sweeping, lifting.    PT Treatment/Interventions ADLs/Self Care Home Management;Moist Heat;Therapeutic activities;Functional mobility training;Electrical Stimulation;Cryotherapy;Therapeutic exercise;Patient/family education;Manual techniques;Dry needling    PT Next Visit Plan 1 more visit and then DC , FOTO?Marland Kitchen answer questions about body mech.  try hinging    PT Home Exercise Plan Access Code [facility]    Consulted and Agree with Plan of Care Patient             Patient will benefit from skilled therapeutic intervention in order to improve the following deficits and impairments:  Decreased endurance, Decreased mobility, Pain, Obesity, Postural dysfunction, Impaired UE functional use, Increased fascial restricitons, Impaired flexibility, Decreased strength  Visit Diagnosis: Midline low back pain with left-sided sciatica, unspecified chronicity  Muscle weakness (generalized)     Problem List Patient Active Problem List   Diagnosis Date Noted   Panniculitis 03/06/2021   Urinary tract infectious disease 08/07/2018   Menorrhagia 08/07/2018   Dysuria 08/07/2018   Acute pansinusitis 08/07/2018   Cough, persistent 07/01/2017   Allergic rhinitis 12/09/2015   Hidradenitis 03/25/2011    Paxtyn Wisdom, PT 04/16/2021, 10:33 AM  [facility] Outpatient Rehabilitation [facility] [address] [place], [place], [address] Phone: [phone]   Fax:  [phone]  Name: Regina Daniels MRN: [id number] Date of Birth: 1956-01-31   [name], PT 04/16/21 10:33 AM Phone: [phone] Fax: [phone]

## 2021-04-23 ENCOUNTER — Other Ambulatory Visit: Payer: Self-pay

## 2021-04-23 ENCOUNTER — Ambulatory Visit: Payer: 59 | Attending: Plastic Surgery | Admitting: Physical Therapy

## 2021-04-23 ENCOUNTER — Encounter: Payer: Self-pay | Admitting: Physical Therapy

## 2021-04-23 DIAGNOSIS — M6281 Muscle weakness (generalized): Secondary | ICD-10-CM | POA: Diagnosis present

## 2021-04-23 DIAGNOSIS — M5442 Lumbago with sciatica, left side: Secondary | ICD-10-CM | POA: Insufficient documentation

## 2021-04-23 NOTE — Therapy (Signed)
[place], [place], [address] Phone: [phone]   Fax:  [phone]  Physical Therapy Treatment/Discharge  Patient Details  Name: Regina Daniels MRN: [id number] Date of Birth: [birth year] Referring Provider (PT): Audelia Hives, DO   Encounter Date: 04/23/2021   PT End of Session - 04/23/21 1213     Visit Number 5    Date for PT Re-Evaluation 04/29/21    Authorization Type UHC    PT Start Time 0930    PT Stop Time 1016    PT Time Calculation (min) 46 min    Activity Tolerance Patient tolerated treatment well    Behavior During Therapy [facility] for tasks assessed/performed             Past Medical History:  Diagnosis Date   Allergy    Anemia    Angio-edema    Anxiety    Arthritis    Chronic headaches    Sinus   Dysrhythmia    palpitations   GERD (gastroesophageal reflux disease)    Groin abscess    Hypertension    since age 49   Hypothyroidism    PONV (postoperative nausea and vomiting)    Recurrent upper respiratory infection (URI)    Sleep difficulties    Thyroid disease    hypothyroidism    Past Surgical History:  Procedure Laterality Date   Ceiba lumpectomy   INCISE AND DRAIN ABCESS     abscess left groin   INGUINAL HIDRADENITIS EXCISION  2012   bil   SHOULDER ACROMIOPLASTY Left 04/09/2015   Procedure: SHOULDER ACROMIOPLASTY;  Surgeon: Earlie Server, MD;  Location: [place];  Service: Orthopedics;  Laterality: Left;   SHOULDER ARTHROSCOPY WITH OPEN ROTATOR CUFF REPAIR Left 04/09/2015   Procedure: LEFT SHOULDER ARTHROSCOPY DEBRIDEMENT ,  with open acromioplasty and rotator cuff repair;  Surgeon: Earlie Server, MD;  Location: [place];  Service: Orthopedics;  Laterality: Left;   SINOSCOPY     TEMPOROMANDIBULAR JOINT SURGERY  2002   TUBAL LIGATION      There were no vitals filed for  this visit.   Subjective Assessment - 04/23/21 0941     Subjective No pain .  Goes to [facility] 2 x per week  for senior fitness classes and plans to return.  She was off awhile due to [place]. Her cousin is still with her. She has increased pain in back with cooking > 1 hour.    Currently in Pain? No/denies                [facility] PT Assessment - 04/23/21 0001       AROM   Lumbar Flexion WNL    Lumbar Extension WNL    Lumbar - Right Side Bend min pain to Rt, WNL    Lumbar - Left Side Bend WNL    Lumbar - Right Rotation WNL    Lumbar - Left Rotation WNL      Strength   Overall Strength Comments knees 5/5    Right Hip Flexion 3+/5    Right Hip ABduction 4-/5    Right Hip ADduction 3/5    Left Hip Flexion 3+/5    Left Hip ABduction 4-/5    Left Hip ADduction 3/5              Skilled therapy interventions:   Therapeutic Exercise:  NuStep  L 5 LE only than added UE to assist , 7 min   Seated hamstring stretch 30 sec x 2  Piriformis 30 sec x 2   Standing core            Blue band shoulder extension  x 10           Single sidefacing adductor x 8           Standing arms with slastix bands                                      Diagonal pull 1 arm x 10 green band              Palloff press x 10 BLUE band , min pain on Rt side 1/10 pain   Hip abduction 2 x 10 each LE  Clam no band x 20   Self care/Pt. Education:   HEP, POC, DC, goals        PT Long Term Goals - 04/23/21 1011       PT LONG TERM GOAL #1   Title Pt will be I with HEP for core, hips    Status Achieved      PT LONG TERM GOAL #2   Title Pt will be able to note longer periods of standing for light housework  with pain < 2/10 most of the time    Baseline can be 5/10 if standing too long, cooking    Status On-going      PT LONG TERM GOAL #3   Title Pt will be able to lift, squat without increased back pain for moderate sized items ( upt o 20 lbs)    Status Achieved      PT LONG TERM GOAL  #4   Title Pt will be able to resume her fitness class at the senior center without having to modify for back pain or excessive fatigue    Baseline has had limited time due to family, personal issues    Status Not Met                   Plan - 04/23/21 0952     Clinical Impression Statement Patient has learned strategies for pain control, exercises and body mechanics.  She cont to have pain in back with extended periods of standing.  She is discharged from PT. [facility] once she is back to her normal schedule (cousin living with her and needs A )    PT Treatment/Interventions ADLs/Self Care Home Management;Moist Heat;Therapeutic activities;Functional mobility training;Electrical Stimulation;Cryotherapy;Therapeutic exercise;Patient/family education;Manual techniques;Dry needling    PT Next Visit Plan DC    PT Home Exercise Plan Access Code [facility]    Consulted and Agree with Plan of Care Patient             Patient will benefit from skilled therapeutic intervention in order to improve the following deficits and impairments:  Decreased endurance, Decreased mobility, Pain, Obesity, Postural dysfunction, Impaired UE functional use, Increased fascial restricitons, Impaired flexibility, Decreased strength  Visit Diagnosis: Midline low back pain with left-sided sciatica, unspecified chronicity  Muscle weakness (generalized)     Problem List Patient Active Problem List   Diagnosis Date Noted   Panniculitis 03/06/2021   Urinary tract infectious disease 08/07/2018   Menorrhagia 08/07/2018   Dysuria 08/07/2018   Acute pansinusitis 08/07/2018   Cough,  persistent 07/01/2017   Allergic rhinitis 12/09/2015   Hidradenitis 03/25/2011    Ronda Kazmi, PT 04/23/2021, 12:14 PM  [facility] [address] [place], [place], [address] Phone: [phone]   Fax:  [phone]  Name: Regina Daniels MRN:  [id number] Date of Birth: [birth year]   PHYSICAL THERAPY DISCHARGE SUMMARY  Visits from Start of Care: 5  Current functional level related to goals / functional outcomes: No pain unless standing > 1 hour     Remaining deficits: Hip weakness, min    Education / Equipment: HEP, core    Patient agrees to discharge. Patient goals were partially met. Patient is being discharged due to being pleased with the current functional level.   Raeford Razor, PT 04/23/21 1:30 PM Phone: [phone] Fax: [phone]

## 2021-05-07 ENCOUNTER — Ambulatory Visit: Payer: 59 | Admitting: Allergy

## 2021-05-14 ENCOUNTER — Encounter: Payer: Self-pay | Admitting: Plastic Surgery

## 2021-07-31 ENCOUNTER — Encounter: Payer: Self-pay | Admitting: Plastic Surgery

## 2021-08-11 ENCOUNTER — Other Ambulatory Visit: Payer: Self-pay

## 2021-08-11 ENCOUNTER — Encounter: Payer: Self-pay | Admitting: Surgical

## 2021-08-11 ENCOUNTER — Ambulatory Visit: Payer: Medicare Other | Admitting: Physician Assistant

## 2021-08-11 VITALS — Ht 65.0 in | Wt 191.0 lb

## 2021-08-11 DIAGNOSIS — M793 Panniculitis, unspecified: Secondary | ICD-10-CM

## 2021-08-11 NOTE — Progress Notes (Signed)
° °  Referring Provider Donita Brooks, MD [address] [place],  [place] [address]   CC:  Chief Complaint  Patient presents with   Follow-up      Regina Daniels is an 66 y.o. female.  HPI: Patient is a 66 y.o. year old female with PMH of hidradenitis on metformin as well as frequent panniculitis who presents to clinic after completing a course of physical therapy.  She was initially seen for consult on 03/06/2021 by Dr. Ulice Bold.  At that time, she complained of frequent yeast infections and skin breakdown in the area of her pannus.  She expressed interest in surgical intervention to remove the excess tissue in order to improve her hygiene given rashes refractory to medical management.  They discussed intervention via panniculectomy, but want her to first complete physical therapy to see if that improves symptoms.  Today, patient tells that she still experiences infra pannus rashes for which she has tried treating with prescribed creams, but that they have not helped.  She also tells me that her clothes do not fit well and she has to buy sizes that are too large.  Patient states that the physical therapy may have helped with her lumbar degenerative disc disease, but did not reduce the frequency of her rashes.  She is still very much interested in surgical intervention via panniculectomy to remove the excess bothersome skin/tissue.  Patient has lost 10 to 20 pounds intentionally over the course of the past year and continues to work towards healthier lifestyle.   Review of Systems General: Endorses rashes, bothersome pannus.  Physical Exam Vitals with BMI 08/11/2021 03/06/2021 12/10/2020  Height 5\' 5"  5' 5.5" 5\' 5"   Weight 191 lbs 193 lbs 199 lbs  BMI 31.78 31.62 33.12  Systolic - 138 120  Diastolic - 76 78  Pulse - 60 50    General:  No acute distress,  Alert and oriented, Non-Toxic, Normal speech and affect Psych: Normal behavior and mood Abdomen: Mild irritation bilaterally  beneath pannus.  Assessment/Plan  Patient is interested in pursuing surgical intervention for panniculectomy. Patient has completed at least 6 weeks of physical therapy to see if that would help with her recurrent panniculitis in setting of hidradenitis.  Photos were obtained 03/06/2021  Discussed with patient we would submit to insurance for authorization, discussed approval could take up to 6 weeks.   08/11/2021, 11:44 AM

## 2021-09-07 ENCOUNTER — Telehealth: Payer: Self-pay | Admitting: Plastic Surgery

## 2021-09-07 NOTE — Telephone Encounter (Signed)
Returned patient's call to advise her that I received message about her needing to cancel surgery and the surgery and appts have been cancelled. Advised her I would call her back to give her a new date. Patient will have to help her son who is having surgery during the same week she is. Pt understands I'll be in touch with new date/time and we will reschedule appt.

## 2021-09-18 ENCOUNTER — Encounter: Payer: Self-pay | Admitting: Plastic Surgery

## 2021-09-18 NOTE — Telephone Encounter (Signed)
See msg

## 2021-10-16 ENCOUNTER — Encounter: Payer: 59 | Admitting: Surgical

## 2021-11-13 ENCOUNTER — Encounter: Payer: 59 | Admitting: Surgical

## 2021-11-23 ENCOUNTER — Ambulatory Visit (INDEPENDENT_AMBULATORY_CARE_PROVIDER_SITE_OTHER): Payer: 59 | Admitting: Family Medicine

## 2021-11-23 ENCOUNTER — Encounter: Payer: Self-pay | Admitting: Family Medicine

## 2021-11-23 VITALS — BP 124/82 | HR 83 | Temp 98.1°F | Ht 65.0 in | Wt 188.8 lb

## 2021-11-23 DIAGNOSIS — L732 Hidradenitis suppurativa: Secondary | ICD-10-CM | POA: Diagnosis not present

## 2021-11-23 DIAGNOSIS — E7800 Pure hypercholesterolemia, unspecified: Secondary | ICD-10-CM | POA: Diagnosis not present

## 2021-11-23 DIAGNOSIS — E039 Hypothyroidism, unspecified: Secondary | ICD-10-CM | POA: Diagnosis not present

## 2021-11-23 DIAGNOSIS — I10 Essential (primary) hypertension: Secondary | ICD-10-CM

## 2021-11-23 NOTE — Progress Notes (Signed)
? ?Subjective:  ? ? Patient ID: Regina Daniels, female    DOB: 1955-07-23, 66 y.o.   MRN: [id number] ? ?HPI ?Patient is a very pleasant 66 year old African-American female here today to establish care with me.  Past medical history is significant for hypertension, hyperlipidemia, obesity, hidradenitis suppurativa, and hypothyroidism.  She also believes that her creatinine was recently elevated at 1.5 although she is not sure.  Therefore it sounds like she may have underlying chronic kidney disease stage IIIb.  She is currently on metformin as well as spironolactone for hidradenitis suppurativa.  She is planning to undergo a panniculectomy later this summer due to recurrent cyst and goals forming in her abdomen.  She is on triamterene hydrochlorothiazide for hypertension.  She has to take potassium pills due to hypokalemia secondary to the blood pressure medication.  She is not on an ACE or an ARB.  She is on metformin for hidradenitis suppurativa.  She denies any history of diabetes.  However her creatinine would be borderline at 1.5 to take metformin.  She is on levothyroxine for hypothyroidism.  She sees gynecology who performs her mammograms and her Pap smears and her bone density test.  She states that her colonoscopy is not quite yet due although she is unsure of her last date. ?Past Medical History:  ?Diagnosis Date  ? Allergy   ? Anemia   ? Angio-edema   ? Anxiety   ? Arthritis   ? Chronic headaches   ? Sinus  ? Dysrhythmia   ? palpitations  ? GERD (gastroesophageal reflux disease)   ? Groin abscess   ? Hypertension   ? since age 49  ? Hypothyroidism   ? PONV (postoperative nausea and vomiting)   ? Recurrent upper respiratory infection (URI)   ? Sleep difficulties   ? Thyroid disease   ? hypothyroidism  ? ?Past Surgical History:  ?Procedure Laterality Date  ? ABDOMINAL HYSTERECTOMY  1993  ? [place]  ? Lt lumpectomy  ? INCISE AND DRAIN ABCESS    ? abscess left groin  ? INGUINAL HIDRADENITIS  EXCISION  2012  ? bil  ? SHOULDER ACROMIOPLASTY Left 04/09/2015  ? Procedure: SHOULDER ACROMIOPLASTY;  Surgeon: Earlie Server, MD;  Location: [facility];  Service: Orthopedics;  Laterality: Left;  ? SHOULDER ARTHROSCOPY WITH OPEN ROTATOR CUFF REPAIR Left 04/09/2015  ? Procedure: LEFT SHOULDER ARTHROSCOPY DEBRIDEMENT ,  with open acromioplasty and rotator cuff repair;  Surgeon: Earlie Server, MD;  Location: [facility];  Service: Orthopedics;  Laterality: Left;  ? SINOSCOPY    ? TEMPOROMANDIBULAR JOINT SURGERY  2002  ? TUBAL LIGATION    ? ?Current Outpatient Medications on File Prior to Visit  ?Medication Sig Dispense Refill  ? acyclovir ointment (ZOVIRAX) 5 % Apply topically See admin instructions.    ? atenolol (TENORMIN) 25 MG tablet Take 25 mg by mouth daily.    ? cetirizine (ZYRTEC) 10 MG tablet Take 10 mg by mouth daily.      ? clindamycin (CLEOCIN T) 1 % external solution Apply topically daily.    ? clindamycin (CLEOCIN T) 1 % lotion     ? estradiol (ESTRACE) 1 MG tablet TK 1 T PO QD    ? fexofenadine (ALLEGRA) 30 MG/5ML suspension Take 30 mg by mouth daily.    ? fluticasone (FLONASE) 50 MCG/ACT nasal spray Place into the nose.    ? ibuprofen (ADVIL) 800 MG tablet ibuprofen 800 mg tablet ? TK  1 T PO TID    ? ipratropium (ATROVENT) 0.06 % nasal spray Apply 1 to 2 sprays in each nostril twice a day as needed for runny nose. 15 mL 5  ? levothyroxine (SYNTHROID, LEVOTHROID) 50 MCG tablet Take 50 mcg by mouth daily before breakfast.    ? meloxicam (MOBIC) 15 MG tablet Take 1 tablet (15 mg total) by mouth daily. 30 tablet 0  ? metFORMIN (GLUCOPHAGE-XR) 750 MG 24 hr tablet Take 750 mg by mouth daily.    ? olopatadine (PATANOL) 0.1 % ophthalmic solution Place 1 drop into both eyes 2 (two) times daily. 5 mL 5  ? Olopatadine HCl 0.6 % SOLN 2 sprays twice a day for maintenance control of post-nasal drip 30.5 g 5  ? pantoprazole (PROTONIX) 40 MG tablet Take 40 mg by mouth as needed.     ?  potassium chloride (KLOR-CON) 10 MEQ tablet potassium chloride ER 10 mEq tablet,extended release ? TK 1 T PO D    ? rosuvastatin (CRESTOR) 10 MG tablet Take 10 mg by mouth daily.    ? spironolactone (ALDACTONE) 50 MG tablet Take 50 mg by mouth daily.    ? triamterene-hydrochlorothiazide (MAXZIDE) 75-50 MG per tablet Take 1 tablet by mouth daily.    ? ?No current facility-administered medications on file prior to visit.  ? ?Allergies  ?Allergen Reactions  ? Meperidine Hcl Hives  ? Other   ?  Steroid injections-headaches  ? Prednisone Other (See Comments)  ?  headache  ? Sudafed [Pseudoephedrine Hcl]   ?  Pt stated, "Makes me feel wired and keeps me up all night"  ? Demerol Hives and Rash  ?  All over body  ? Epinephrine Palpitations and Other (See Comments)  ? ?Social History  ? ?Socioeconomic History  ? Marital status: Widowed  ?  Spouse name: Not on file  ? Number of children: Not on file  ? Years of education: Not on file  ? Highest education level: Not on file  ?Occupational History  ? Not on file  ?Tobacco Use  ? Smoking status: Never  ? Smokeless tobacco: Never  ?Vaping Use  ? Vaping Use: Never used  ?Substance and Sexual Activity  ? Alcohol use: No  ? Drug use: No  ? Sexual activity: Never  ?Other Topics Concern  ? Not on file  ?Social History Narrative  ? Not on file  ? ?Social Determinants of Health  ? ?Financial Resource Strain: Not on file  ?Food Insecurity: Not on file  ?Transportation Needs: Not on file  ?Physical Activity: Not on file  ?Stress: Not on file  ?Social Connections: Not on file  ?Intimate Partner Violence: Not on file  ? ?Family History  ?Problem Relation Age of Onset  ? Heart disease Mother   ? Diabetes Mother   ? Stroke Mother   ? Thyroid disease Mother   ? Cancer Father   ? Heart disease Father   ? Diabetes Father   ? Stroke Father   ? Thyroid disease Sister   ? Asthma Sister   ? Asthma Brother   ? Asthma Brother   ? Asthma Brother   ? Asthma Sister   ? Asthma Paternal Uncle    ? ? ? ?Review of Systems ? ?   ?Objective:  ? Physical Exam ?Constitutional:   ?   General: She is not in acute distress. ?   Appearance: Normal appearance. She is not ill-appearing, toxic-appearing or diaphoretic.  ?HENT:  ?  Head: Normocephalic and atraumatic.  ?   Right Ear: Tympanic membrane and ear canal normal.  ?   Left Ear: Tympanic membrane and ear canal normal.  ?   Nose: No congestion.  ?   Mouth/Throat:  ?   Mouth: Mucous membranes are moist.  ?   Pharynx: Oropharynx is clear. No oropharyngeal exudate or posterior oropharyngeal erythema.  ?Eyes:  ?   Conjunctiva/sclera: Conjunctivae normal.  ?Neck:  ?   Vascular: No carotid bruit.  ?Cardiovascular:  ?   Rate and Rhythm: Normal rate and regular rhythm.  ?   Pulses: Normal pulses.  ?   Heart sounds: Normal heart sounds. No murmur heard. ?  No friction rub. No gallop.  ?Pulmonary:  ?   Effort: Pulmonary effort is normal. No respiratory distress.  ?   Breath sounds: Normal breath sounds. No stridor. No wheezing, rhonchi or rales.  ?Abdominal:  ?   General: Bowel sounds are normal. There is no distension.  ?   Palpations: Abdomen is soft.  ?   Tenderness: There is no abdominal tenderness. There is no guarding.  ?Musculoskeletal:  ?   Cervical back: No rigidity.  ?   Right lower leg: No edema.  ?   Left lower leg: No edema.  ?Lymphadenopathy:  ?   Cervical: No cervical adenopathy.  ?Skin: ?   Coloration: Skin is not jaundiced.  ?   Findings: No erythema.  ?Neurological:  ?   Mental Status: She is alert.  ?Psychiatric:     ?   Mood and Affect: Mood normal.     ?   Behavior: Behavior normal.     ?   Thought Content: Thought content normal.  ? ? ? ? ? ?   ?Assessment & Plan:  ?Hidradenitis - Plan: CBC with Differential/Platelet, Lipid panel, COMPLETE METABOLIC PANEL WITH GFR, TSH, Hemoglobin A1c ? ?Hypothyroidism, unspecified type - Plan: CBC with Differential/Platelet, Lipid panel, COMPLETE METABOLIC PANEL WITH GFR, TSH, Hemoglobin A1c ? ?Benign essential HTN  - Plan: CBC with Differential/Platelet, Lipid panel, COMPLETE METABOLIC PANEL WITH GFR, TSH, Hemoglobin A1c ? ?Pure hypercholesterolemia - Plan: CBC with Differential/Platelet, Lipid panel, COMPLETE METABOLIC PANEL

## 2021-11-24 NOTE — Progress Notes (Signed)
? ?  Patient ID: Regina Daniels, female    DOB: [birth year], 66 y.o.   MRN: [id number] ? ?Chief Complaint  ?Patient presents with  ? Pre-op Exam  ? ? ?  ICD-10-CM   ?1. Panniculitis  M79.3   ?  ? ? ? ?History of Present Illness: ?Regina Daniels is a 66 y.o.  female  with a history of panniculitis.  She presents for preoperative evaluation for upcoming procedure, panniculectomy, scheduled for 12/17/2021 with Dr. Ulice Bold. ? ?The patient has not had problems with anesthesia.  The only problem that she has had was postoperative nausea and vomiting which was well controlled during most recent surgery.  She does take 81 mg estradiol pill for menopause symptoms which she will hold 2 weeks before and after surgery.  She has a bed at home that will incline and decline to help with recovery.  She has a history of spider veins s/p ablation.  Denies any personal history of cancer, MI, or CVA.  No personal or family history of blood clots or clotting disorder.  She will hold any and all vitamins 7 days prior to surgery. ? ?Summary of Previous Visit: Patient was seen for initial consult 03/06/2021.  At that time, she complained of frequent skin infections and breakdown in the area of her pannus.  Discussed intervention via panniculectomy, but ordered PT as a first step.  When she was seen again on 08/11/2021, she expressed that she was still very much interested surgical intervention via panniculectomy.  She continues to lose weight.  States that her clothes do not fit well.  Still continues to apply prescribed creams and powders to her infra pannus rashes.  She also continues to experience recurrent hidradenitis-related cysts in the area of her pannus. ? ?Job: Retired. ? ?PMH Significant for: Panniculitis, hidradenitis, HLD, GERD, HTN, asthma, thyroid disorder, allergic rhinitis. ? ? ?Past Medical History: ?Allergies: ?Allergies  ?Allergen Reactions  ? Meperidine Hcl Hives  ? Other   ?  Steroid injections-headaches  ? Prednisone  Other (See Comments)  ?  headache  ? Sudafed [Pseudoephedrine Hcl]   ?  Pt stated, "Makes me feel wired and keeps me up all night"  ? Demerol Hives and Rash  ?  All over body  ? Epinephrine Palpitations and Other (See Comments)  ? ? ?Current Medications: ? ?Current Outpatient Medications:  ?  acyclovir (ZOVIRAX) 800 MG tablet, Take 800 mg by mouth 5 (five) times daily., Disp: , Rfl:  ?  acyclovir ointment (ZOVIRAX) 5 %, Apply topically See admin instructions., Disp: , Rfl:  ?  cetirizine (ZYRTEC) 10 MG tablet, Take 10 mg by mouth daily.  , Disp: , Rfl:  ?  clindamycin (CLEOCIN T) 1 % external solution, Apply topically daily., Disp: , Rfl:  ?  clindamycin (CLEOCIN T) 1 % lotion, , Disp: , Rfl:  ?  estradiol (ESTRACE) 1 MG tablet, TK 1 T PO QD, Disp: , Rfl:  ?  fluticasone (FLONASE) 50 MCG/ACT nasal spray, Place into the nose., Disp: , Rfl:  ?  ipratropium (ATROVENT) 0.06 % nasal spray, Apply 1 to 2 sprays in each nostril twice a day as needed for runny nose., Disp: 15 mL, Rfl: 5 ?  ketoconazole (NIZORAL) 2 % cream, Apply 1 application. topically daily., Disp: , Rfl:  ?  levothyroxine (SYNTHROID, LEVOTHROID) 50 MCG tablet, Take 50 mcg by mouth daily before breakfast., Disp: , Rfl:  ?  lisinopril-hydrochlorothiazide (ZESTORETIC) 10-12.5 MG tablet, Take 1 tablet by mouth  daily., Disp: , Rfl:  ?  metFORMIN (GLUCOPHAGE-XR) 750 MG 24 hr tablet, Take 750 mg by mouth daily., Disp: , Rfl:  ?  olopatadine (PATANOL) 0.1 % ophthalmic solution, Place 1 drop into both eyes 2 (two) times daily., Disp: 5 mL, Rfl: 5 ?  Olopatadine HCl 0.6 % SOLN, 2 sprays twice a day for maintenance control of post-nasal drip, Disp: 30.5 g, Rfl: 5 ?  pantoprazole (PROTONIX) 40 MG tablet, Take 40 mg by mouth as needed. , Disp: , Rfl:  ?  potassium chloride (KLOR-CON) 10 MEQ tablet, potassium chloride ER 10 mEq tablet,extended release  TK 1 T PO D, Disp: , Rfl:  ?  rosuvastatin (CRESTOR) 10 MG tablet, Take 10 mg by mouth daily., Disp: , Rfl:  ?   spironolactone (ALDACTONE) 50 MG tablet, Take 50 mg by mouth daily., Disp: , Rfl:  ?  triamcinolone cream (KENALOG) 0.1 %, Apply 1 application. topically 2 (two) times daily., Disp: , Rfl:  ?  triamterene-hydrochlorothiazide (MAXZIDE) 75-50 MG per tablet, Take 1 tablet by mouth daily., Disp: , Rfl:  ? ?Past Medical Problems: ?Past Medical History:  ?Diagnosis Date  ? Allergy   ? Anemia   ? Angio-edema   ? Anxiety   ? Arthritis   ? Chronic headaches   ? Sinus  ? Dysrhythmia   ? palpitations  ? GERD (gastroesophageal reflux disease)   ? Groin abscess   ? Hidradenitis suppurativa   ? reason she is on metformin and spironolactone  ? Hypertension   ? since age 88  ? Hypothyroidism   ? PONV (postoperative nausea and vomiting)   ? Recurrent upper respiratory infection (URI)   ? Sleep difficulties   ? Thyroid disease   ? hypothyroidism  ? ? ?Past Surgical History: ?Past Surgical History:  ?Procedure Laterality Date  ? ABDOMINAL HYSTERECTOMY  1993  ? BREAST SURGERY  1993 and 1994  ? Lt lumpectomy  ? INCISE AND DRAIN ABCESS    ? abscess left groin  ? INGUINAL HIDRADENITIS EXCISION  2012  ? bil  ? SHOULDER ACROMIOPLASTY Left 04/09/2015  ? Procedure: SHOULDER ACROMIOPLASTY;  Surgeon: Frederico Hamman, MD;  Location: [facility];  Service: Orthopedics;  Laterality: Left;  ? SHOULDER ARTHROSCOPY WITH OPEN ROTATOR CUFF REPAIR Left 04/09/2015  ? Procedure: LEFT SHOULDER ARTHROSCOPY DEBRIDEMENT ,  with open acromioplasty and rotator cuff repair;  Surgeon: Frederico Hamman, MD;  Location: [facility];  Service: Orthopedics;  Laterality: Left;  ? SINOSCOPY    ? TEMPOROMANDIBULAR JOINT SURGERY  2002  ? TUBAL LIGATION    ? ? ?Social History: ?Social History  ? ?Socioeconomic History  ? Marital status: Widowed  ?  Spouse name: Not on file  ? Number of children: Not on file  ? Years of education: Not on file  ? Highest education level: Not on file  ?Occupational History  ? Not on file  ?Tobacco Use  ? Smoking status:  Never  ? Smokeless tobacco: Never  ?Vaping Use  ? Vaping Use: Never used  ?Substance and Sexual Activity  ? Alcohol use: No  ? Drug use: No  ? Sexual activity: Never  ?Other Topics Concern  ? Not on file  ?Social History Narrative  ? Not on file  ? ?Social Determinants of Health  ? ?Financial Resource Strain: Not on file  ?Food Insecurity: Not on file  ?Transportation Needs: Not on file  ?Physical Activity: Not on file  ?Stress: Not on file  ?Social Connections:  Not on file  ?Intimate Partner Violence: Not on file  ? ? ?Family History: ?Family History  ?Problem Relation Age of Onset  ? Heart disease Mother   ? Diabetes Mother   ? Stroke Mother   ? Thyroid disease Mother   ? Cancer Father   ? Heart disease Father   ? Diabetes Father   ? Stroke Father   ? Thyroid disease Sister   ? Asthma Sister   ? Asthma Brother   ? Asthma Brother   ? Asthma Brother   ? Asthma Sister   ? Asthma Paternal Uncle   ? ? ?Review of Systems: ?ROS ?Denies recent chest pain, difficulty breathing, fevers, or leg swelling. ? ?Physical Exam: ?Vital Signs ?BP (!) 145/74 (BP Location: Left Arm, Patient Position: Sitting, Cuff Size: Large)   Pulse 63   Ht 5' 5.5" (1.664 m)   Wt 190 lb 3.2 oz (86.3 kg)   SpO2 99%   BMI 31.17 kg/m?  ? ?Physical Exam ?Constitutional:   ?   General: Not in acute distress. ?   Appearance: Normal appearance. Not ill-appearing.  ?HENT:  ?   Head: Normocephalic and atraumatic.  ?Eyes:  ?   Pupils: Pupils are equal, round. ?Cardiovascular:  ?   Rate and Rhythm: Normal rate. ?   Pulses: Normal pulses.  ?Pulmonary:  ?   Effort: No respiratory distress or increased work of breathing.  Speaks in full sentences. ?Abdominal:  ?   General: Abdomen is flat. No distension.   ?Musculoskeletal: Normal range of motion. No lower extremity swelling or edema.  No obvious varicosities or spider veins. ?Skin: ?   General: Skin is warm and dry.  ?   Findings: No erythema or rash.  ?Neurological:  ?   Mental Status: Alert and oriented  to person, place, and time.  ?Psychiatric:     ?   Mood and Affect: Mood normal.     ?   Behavior: Behavior normal.  ? ? ?Assessment/Plan: ?The patient is scheduled for panniculectomy with Dr. Ulice Boldillingham.  Risks, be

## 2021-11-24 NOTE — H&P (View-Only) (Signed)
Patient ID: Regina Daniels, female    DOB: 12-06-1955, 66 y.o.   MRN: [id number]  Chief Complaint  Patient presents with   Pre-op Exam      ICD-10-CM   1. Panniculitis  M79.3        History of Present Illness: Regina Daniels is a 66 y.o.  female  with a history of panniculitis.  She presents for preoperative evaluation for upcoming procedure, panniculectomy, scheduled for 12/17/2021 with Dr. Ulice Bold.  The patient has not had problems with anesthesia.  The only problem that she has had was postoperative nausea and vomiting which was well controlled during most recent surgery.  She does take 81 mg estradiol pill for menopause symptoms which she will hold 2 weeks before and after surgery.  She has a bed at home that will incline and decline to help with recovery.  She has a history of spider veins s/p ablation.  Denies any personal history of cancer, MI, or CVA.  No personal or family history of blood clots or clotting disorder.  She will hold any and all vitamins 7 days prior to surgery.  Summary of Previous Visit: Patient was seen for initial consult 03/06/2021.  At that time, she complained of frequent skin infections and breakdown in the area of her pannus.  Discussed intervention via panniculectomy, but ordered PT as a first step.  When she was seen again on 08/11/2021, she expressed that she was still very much interested surgical intervention via panniculectomy.  She continues to lose weight.  States that her clothes do not fit well.  Still continues to apply prescribed creams and powders to her infra pannus rashes.  She also continues to experience recurrent hidradenitis-related cysts in the area of her pannus.  Job: Retired.  PMH Significant for: Panniculitis, hidradenitis, HLD, GERD, HTN, asthma, thyroid disorder, allergic rhinitis.   Past Medical History: Allergies: Allergies  Allergen Reactions   Meperidine Hcl Hives   Other     Steroid injections-headaches   Prednisone  Other (See Comments)    headache   Sudafed [Pseudoephedrine Hcl]     Pt stated, "Makes me feel wired and keeps me up all night"   Demerol Hives and Rash    All over body   Epinephrine Palpitations and Other (See Comments)    Current Medications:  Current Outpatient Medications:    acyclovir (ZOVIRAX) 800 MG tablet, Take 800 mg by mouth 5 (five) times daily., Disp: , Rfl:    acyclovir ointment (ZOVIRAX) 5 %, Apply topically See admin instructions., Disp: , Rfl:    cetirizine (ZYRTEC) 10 MG tablet, Take 10 mg by mouth daily.  , Disp: , Rfl:    clindamycin (CLEOCIN T) 1 % external solution, Apply topically daily., Disp: , Rfl:    clindamycin (CLEOCIN T) 1 % lotion, , Disp: , Rfl:    estradiol (ESTRACE) 1 MG tablet, TK 1 T PO QD, Disp: , Rfl:    fluticasone (FLONASE) 50 MCG/ACT nasal spray, Place into the nose., Disp: , Rfl:    ipratropium (ATROVENT) 0.06 % nasal spray, Apply 1 to 2 sprays in each nostril twice a day as needed for runny nose., Disp: 15 mL, Rfl: 5   ketoconazole (NIZORAL) 2 % cream, Apply 1 application. topically daily., Disp: , Rfl:    levothyroxine (SYNTHROID, LEVOTHROID) 50 MCG tablet, Take 50 mcg by mouth daily before breakfast., Disp: , Rfl:    lisinopril-hydrochlorothiazide (ZESTORETIC) 10-12.5 MG tablet, Take 1 tablet by mouth  daily., Disp: , Rfl:    metFORMIN (GLUCOPHAGE-XR) 750 MG 24 hr tablet, Take 750 mg by mouth daily., Disp: , Rfl:    olopatadine (PATANOL) 0.1 % ophthalmic solution, Place 1 drop into both eyes 2 (two) times daily., Disp: 5 mL, Rfl: 5   Olopatadine HCl 0.6 % SOLN, 2 sprays twice a day for maintenance control of post-nasal drip, Disp: 30.5 g, Rfl: 5   pantoprazole (PROTONIX) 40 MG tablet, Take 40 mg by mouth as needed. , Disp: , Rfl:    potassium chloride (KLOR-CON) 10 MEQ tablet, potassium chloride ER 10 mEq tablet,extended release  TK 1 T PO D, Disp: , Rfl:    rosuvastatin (CRESTOR) 10 MG tablet, Take 10 mg by mouth daily., Disp: , Rfl:     spironolactone (ALDACTONE) 50 MG tablet, Take 50 mg by mouth daily., Disp: , Rfl:    triamcinolone cream (KENALOG) 0.1 %, Apply 1 application. topically 2 (two) times daily., Disp: , Rfl:    triamterene-hydrochlorothiazide (MAXZIDE) 75-50 MG per tablet, Take 1 tablet by mouth daily., Disp: , Rfl:   Past Medical Problems: Past Medical History:  Diagnosis Date   Allergy    Anemia    Angio-edema    Anxiety    Arthritis    Chronic headaches    Sinus   Dysrhythmia    palpitations   GERD (gastroesophageal reflux disease)    Groin abscess    Hidradenitis suppurativa    reason she is on metformin and spironolactone   Hypertension    since age 3   Hypothyroidism    PONV (postoperative nausea and vomiting)    Recurrent upper respiratory infection (URI)    Sleep difficulties    Thyroid disease    hypothyroidism    Past Surgical History: Past Surgical History:  Procedure Laterality Date   ABDOMINAL HYSTERECTOMY  1993   BREAST SURGERY  1993 and 1994   Lt lumpectomy   INCISE AND DRAIN ABCESS     abscess left groin   INGUINAL HIDRADENITIS EXCISION  2012   bil   SHOULDER ACROMIOPLASTY Left 04/09/2015   Procedure: SHOULDER ACROMIOPLASTY;  Surgeon: Frederico Hamman, MD;  Location: [facility];  Service: Orthopedics;  Laterality: Left;   SHOULDER ARTHROSCOPY WITH OPEN ROTATOR CUFF REPAIR Left 04/09/2015   Procedure: LEFT SHOULDER ARTHROSCOPY DEBRIDEMENT ,  with open acromioplasty and rotator cuff repair;  Surgeon: Frederico Hamman, MD;  Location: [facility];  Service: Orthopedics;  Laterality: Left;   SINOSCOPY     TEMPOROMANDIBULAR JOINT SURGERY  2002   TUBAL LIGATION      Social History: Social History   Socioeconomic History   Marital status: Widowed    Spouse name: Not on file   Number of children: Not on file   Years of education: Not on file   Highest education level: Not on file  Occupational History   Not on file  Tobacco Use   Smoking status:  Never   Smokeless tobacco: Never  Vaping Use   Vaping Use: Never used  Substance and Sexual Activity   Alcohol use: No   Drug use: No   Sexual activity: Never  Other Topics Concern   Not on file  Social History Narrative   Not on file   Social Determinants of Health   Financial Resource Strain: Not on file  Food Insecurity: Not on file  Transportation Needs: Not on file  Physical Activity: Not on file  Stress: Not on file  Social Connections:  Not on file  Intimate Partner Violence: Not on file    Family History: Family History  Problem Relation Age of Onset   Heart disease Mother    Diabetes Mother    Stroke Mother    Thyroid disease Mother    Cancer Father    Heart disease Father    Diabetes Father    Stroke Father    Thyroid disease Sister    Asthma Sister    Asthma Brother    Asthma Brother    Asthma Brother    Asthma Sister    Asthma Paternal Uncle     Review of Systems: ROS Denies recent chest pain, difficulty breathing, fevers, or leg swelling.  Physical Exam: Vital Signs BP (!) 145/74 (BP Location: Left Arm, Patient Position: Sitting, Cuff Size: Large)   Pulse 63   Ht 5' 5.5" (1.664 m)   Wt 190 lb 3.2 oz (86.3 kg)   SpO2 99%   BMI 31.17 kg/m   Physical Exam Constitutional:      General: Not in acute distress.    Appearance: Normal appearance. Not ill-appearing.  HENT:     Head: Normocephalic and atraumatic.  Eyes:     Pupils: Pupils are equal, round. Cardiovascular:     Rate and Rhythm: Normal rate.    Pulses: Normal pulses.  Pulmonary:     Effort: No respiratory distress or increased work of breathing.  Speaks in full sentences. Abdominal:     General: Abdomen is flat. No distension.   Musculoskeletal: Normal range of motion. No lower extremity swelling or edema.  No obvious varicosities or spider veins. Skin:    General: Skin is warm and dry.     Findings: No erythema or rash.  Neurological:     Mental Status: Alert and oriented  to person, place, and time.  Psychiatric:        Mood and Affect: Mood normal.        Behavior: Behavior normal.    Assessment/Plan: The patient is scheduled for panniculectomy with Dr. Ulice Boldillingham.  Risks, benefits, and alternatives of procedure discussed, questions answered and consent obtained.    Smoking Status: Non-smoker.  Caprini Score: 5; Risk Factors include: Age, BMI greater than 25, and length of planned surgery. Recommendation for mechanical prophylaxis.  She will be holding her oral estradiol 2 weeks before and after surgery.  Encourage early ambulation.   Pictures obtained: 03/06/2021  Post-op Rx sent to pharmacy: Keflex, Zofran, Norco.  Patient was provided with the General Surgical Risk consent document and Pain Medication Agreement prior to their appointment.  They had adequate time to read through the risk consent documents and Pain Medication Agreement. We also discussed them in person together during this preop appointment. All of their questions were answered to their satisfaction.  Recommended calling if they have any further questions.  Risk consent form and Pain Medication Agreement to be scanned into patient's chart.  The risk that can be encountered for this procedure were discussed and include the following but not limited to these: asymmetry, fluid accumulation, firmness of the tissue, skin loss, decrease or no sensation, fat necrosis, bleeding, infection, healing delay.  Deep vein thrombosis, cardiac and pulmonary complications are risks to any procedure.  There are risks of anesthesia, changes to skin sensation and injury to nerves or blood vessels.  The muscle can be temporarily or permanently injured.  You may have an allergic reaction to tape, suture, glue, blood products which can result in skin discoloration, swelling,  pain, skin lesions, poor healing.  Any of these can lead to the need for revisonal surgery or stage procedures.  Weight gain and weigh loss can also  effect the long term appearance. The results are not guaranteed to last a lifetime.  Future surgery may be required.      Electronically signed by: Evelena Leyden, PA-C 11/27/2021 2:01 PM

## 2021-11-27 ENCOUNTER — Ambulatory Visit (INDEPENDENT_AMBULATORY_CARE_PROVIDER_SITE_OTHER): Payer: Medicare Other | Admitting: Physician Assistant

## 2021-11-27 ENCOUNTER — Encounter: Payer: Self-pay | Admitting: Physician Assistant

## 2021-11-27 VITALS — BP 145/74 | HR 63 | Ht 65.5 in | Wt 190.2 lb

## 2021-11-27 DIAGNOSIS — M793 Panniculitis, unspecified: Secondary | ICD-10-CM

## 2021-11-30 MED ORDER — CEPHALEXIN 500 MG PO CAPS: 500.0000 mg | ORAL_CAPSULE | Freq: Four times a day (QID) | ORAL | 0 refills | Status: AC

## 2021-11-30 MED ORDER — ONDANSETRON 4 MG PO TBDP: 4.0000 mg | ORAL_TABLET | Freq: Three times a day (TID) | ORAL | 0 refills | Status: DC | PRN

## 2021-11-30 MED ORDER — HYDROCODONE-ACETAMINOPHEN 5-325 MG PO TABS: 1.0000 | ORAL_TABLET | Freq: Four times a day (QID) | ORAL | 0 refills | Status: AC | PRN

## 2021-11-30 NOTE — Addendum Note (Signed)
Addended by: Evelena Leyden on: 11/30/2021 08:20 AM ? ? Modules accepted: Orders ? ?

## 2021-12-04 ENCOUNTER — Encounter: Payer: 59 | Admitting: Plastic Surgery

## 2021-12-08 ENCOUNTER — Other Ambulatory Visit: Payer: Self-pay

## 2021-12-08 ENCOUNTER — Encounter (HOSPITAL_BASED_OUTPATIENT_CLINIC_OR_DEPARTMENT_OTHER)
Admission: RE | Admit: 2021-12-08 | Discharge: 2021-12-08 | Disposition: A | Discharge status: Home / Self Care | Payer: Medicare Other | Source: Ambulatory Visit | Attending: Plastic Surgery | Admitting: Plastic Surgery

## 2021-12-08 ENCOUNTER — Encounter (HOSPITAL_BASED_OUTPATIENT_CLINIC_OR_DEPARTMENT_OTHER): Payer: Self-pay | Admitting: Plastic Surgery

## 2021-12-08 ENCOUNTER — Encounter (HOSPITAL_BASED_OUTPATIENT_CLINIC_OR_DEPARTMENT_OTHER): Payer: Medicare Other | Attending: Plastic Surgery

## 2021-12-08 DIAGNOSIS — Z01812 Encounter for preprocedural laboratory examination: Secondary | ICD-10-CM | POA: Insufficient documentation

## 2021-12-08 LAB — BASIC METABOLIC PANEL
Anion gap: 7 (ref 5–15)
BUN: 15 mg/dL (ref 8–23)
CO2: 28 mmol/L (ref 22–32)
Calcium: 10.1 mg/dL (ref 8.9–10.3)
Chloride: 105 mmol/L (ref 98–111)
Creatinine, Ser: 1.27 mg/dL — ABNORMAL HIGH (ref 0.44–1.00)
GFR, Estimated: 47 mL/min — ABNORMAL LOW (ref >60)
Glucose, Bld: 77 mg/dL (ref 70–99)
Potassium: 4.8 mmol/L (ref 3.5–5.1)
Sodium: 140 mmol/L (ref 135–145)

## 2021-12-08 MED ORDER — CHLORHEXIDINE GLUCONATE CLOTH 2 % EX PADS: 6.0000 | MEDICATED_PAD | Freq: Once | CUTANEOUS | Status: DC

## 2021-12-08 NOTE — Progress Notes (Signed)
Surgical soap given with instructions, pt verbalized understanding.  

## 2021-12-16 NOTE — Anesthesia Preprocedure Evaluation (Signed)
Anesthesia Evaluation  Patient identified by MRN, date of birth, ID band Patient awake    Reviewed: Allergy & Precautions, NPO status , Patient's Chart, lab work & pertinent test results  History of Anesthesia Complications (+) PONV and history of anesthetic complications  Airway Mallampati: II  TM Distance: >3 FB Neck ROM: Full    Dental no notable dental hx.    Pulmonary neg pulmonary ROS,    Pulmonary exam normal        Cardiovascular hypertension, Pt. on medications Normal cardiovascular exam     Neuro/Psych  Headaches, negative psych ROS   GI/Hepatic Neg liver ROS, GERD  Medicated and Controlled,  Endo/Other  Hypothyroidism   Renal/GU Renal disease     Musculoskeletal  (+) Arthritis ,   Abdominal (+) + obese,   Peds  Hematology negative hematology ROS (+)   Anesthesia Other Findings Panniculitis  Reproductive/Obstetrics                            Anesthesia Physical Anesthesia Plan  ASA: 3  Anesthesia Plan: General   Post-op Pain Management:    Induction: Intravenous  PONV Risk Score and Plan: 4 or greater and Ondansetron, Dexamethasone, Propofol infusion, Treatment may vary due to age or medical condition, Midazolam and Amisulpride  Airway Management Planned: Oral ETT  Additional Equipment:   Intra-op Plan:   Post-operative Plan: Extubation in OR  Informed Consent: I have reviewed the patients History and Physical, chart, labs and discussed the procedure including the risks, benefits and alternatives for the proposed anesthesia with the patient or authorized representative who has indicated his/her understanding and acceptance.     Dental advisory given  Plan Discussed with: CRNA  Anesthesia Plan Comments:        Anesthesia Quick Evaluation

## 2021-12-17 ENCOUNTER — Encounter (HOSPITAL_BASED_OUTPATIENT_CLINIC_OR_DEPARTMENT_OTHER)
Admission: RE | Disposition: A | Discharge status: Home / Self Care | Payer: Self-pay | Source: Home / Self Care | Attending: Plastic Surgery

## 2021-12-17 ENCOUNTER — Ambulatory Visit (HOSPITAL_BASED_OUTPATIENT_CLINIC_OR_DEPARTMENT_OTHER): Payer: Medicare Other | Admitting: Anesthesiology

## 2021-12-17 ENCOUNTER — Observation Stay (HOSPITAL_BASED_OUTPATIENT_CLINIC_OR_DEPARTMENT_OTHER)
Admission: RE | Admit: 2021-12-17 | Discharge: 2021-12-18 | Disposition: A | Discharge status: Home / Self Care | Payer: Medicare Other | Attending: Plastic Surgery | Admitting: Plastic Surgery

## 2021-12-17 ENCOUNTER — Other Ambulatory Visit: Payer: Self-pay

## 2021-12-17 ENCOUNTER — Encounter (HOSPITAL_BASED_OUTPATIENT_CLINIC_OR_DEPARTMENT_OTHER): Payer: Self-pay | Admitting: Plastic Surgery

## 2021-12-17 DIAGNOSIS — Z7984 Long term (current) use of oral hypoglycemic drugs: Secondary | ICD-10-CM | POA: Insufficient documentation

## 2021-12-17 DIAGNOSIS — R109 Unspecified abdominal pain: Secondary | ICD-10-CM | POA: Diagnosis not present

## 2021-12-17 DIAGNOSIS — K219 Gastro-esophageal reflux disease without esophagitis: Secondary | ICD-10-CM | POA: Diagnosis not present

## 2021-12-17 DIAGNOSIS — M793 Panniculitis, unspecified: Secondary | ICD-10-CM | POA: Diagnosis not present

## 2021-12-17 DIAGNOSIS — L732 Hidradenitis suppurativa: Secondary | ICD-10-CM | POA: Diagnosis not present

## 2021-12-17 DIAGNOSIS — D649 Anemia, unspecified: Secondary | ICD-10-CM | POA: Diagnosis not present

## 2021-12-17 DIAGNOSIS — R55 Syncope and collapse: Secondary | ICD-10-CM | POA: Insufficient documentation

## 2021-12-17 DIAGNOSIS — Z79899 Other long term (current) drug therapy: Secondary | ICD-10-CM | POA: Diagnosis not present

## 2021-12-17 DIAGNOSIS — I10 Essential (primary) hypertension: Secondary | ICD-10-CM

## 2021-12-17 DIAGNOSIS — Z96612 Presence of left artificial shoulder joint: Secondary | ICD-10-CM | POA: Insufficient documentation

## 2021-12-17 DIAGNOSIS — N179 Acute kidney failure, unspecified: Secondary | ICD-10-CM | POA: Diagnosis not present

## 2021-12-17 DIAGNOSIS — E039 Hypothyroidism, unspecified: Secondary | ICD-10-CM | POA: Insufficient documentation

## 2021-12-17 DIAGNOSIS — M1990 Unspecified osteoarthritis, unspecified site: Secondary | ICD-10-CM

## 2021-12-17 DIAGNOSIS — J45909 Unspecified asthma, uncomplicated: Secondary | ICD-10-CM | POA: Insufficient documentation

## 2021-12-17 DIAGNOSIS — E875 Hyperkalemia: Secondary | ICD-10-CM | POA: Insufficient documentation

## 2021-12-17 HISTORY — PX: PANNICULECTOMY: SHX5360

## 2021-12-17 SURGERY — PANNICULECTOMY
Anesthesia: General | Site: Abdomen

## 2021-12-17 MED ORDER — ONDANSETRON 4 MG PO TBDP: 4.0000 mg | ORAL_TABLET | Freq: Four times a day (QID) | ORAL | Status: DC | PRN

## 2021-12-17 MED ORDER — ONDANSETRON HCL 4 MG/2ML IJ SOLN
INTRAMUSCULAR | Status: AC
  Filled 2021-12-17: qty 2

## 2021-12-17 MED ORDER — FENTANYL CITRATE (PF) 100 MCG/2ML IJ SOLN
INTRAMUSCULAR | Status: DC | PRN
  Administered 2021-12-17 (×2): 50 ug via INTRAVENOUS
  Administered 2021-12-17: 100 ug via INTRAVENOUS

## 2021-12-17 MED ORDER — HYDROMORPHONE HCL 1 MG/ML IJ SOLN
INTRAMUSCULAR | Status: AC
  Filled 2021-12-17: qty 0.5

## 2021-12-17 MED ORDER — LACTATED RINGERS IV SOLN: INTRAVENOUS | Status: DC

## 2021-12-17 MED ORDER — SUGAMMADEX SODIUM 200 MG/2ML IV SOLN
INTRAVENOUS | Status: DC | PRN
  Administered 2021-12-17: 200 mg via INTRAVENOUS

## 2021-12-17 MED ORDER — HYDROCODONE-ACETAMINOPHEN 5-325 MG PO TABS
1.0000 | ORAL_TABLET | ORAL | Status: DC | PRN
  Administered 2021-12-17: 2 via ORAL
  Filled 2021-12-17: qty 2

## 2021-12-17 MED ORDER — ATROPINE SULFATE 0.4 MG/ML IV SOLN
INTRAVENOUS | Status: AC
  Filled 2021-12-17: qty 1

## 2021-12-17 MED ORDER — ACETAMINOPHEN 325 MG PO TABS
325.0000 mg | ORAL_TABLET | Freq: Four times a day (QID) | ORAL | Status: DC
  Administered 2021-12-17 – 2021-12-18 (×3): 325 mg via ORAL
  Filled 2021-12-17 (×3): qty 1

## 2021-12-17 MED ORDER — ACETAMINOPHEN 325 MG PO TABS: 650.0000 mg | ORAL_TABLET | ORAL | Status: DC | PRN

## 2021-12-17 MED ORDER — SORBITOL 70 % SOLN: 30.0000 mL | Freq: Every day | Status: DC | PRN

## 2021-12-17 MED ORDER — FENTANYL CITRATE (PF) 100 MCG/2ML IJ SOLN
INTRAMUSCULAR | Status: AC
  Filled 2021-12-17: qty 2

## 2021-12-17 MED ORDER — LIDOCAINE-EPINEPHRINE 1 %-1:100000 IJ SOLN
INTRAMUSCULAR | Status: AC
  Filled 2021-12-17: qty 1

## 2021-12-17 MED ORDER — LIDOCAINE HCL (PF) 1 % IJ SOLN
INTRAMUSCULAR | Status: AC
  Filled 2021-12-17: qty 30

## 2021-12-17 MED ORDER — SENNA 8.6 MG PO TABS
1.0000 | ORAL_TABLET | Freq: Two times a day (BID) | ORAL | Status: DC
  Administered 2021-12-17: 8.6 mg via ORAL
  Filled 2021-12-17: qty 1

## 2021-12-17 MED ORDER — DEXAMETHASONE SODIUM PHOSPHATE 4 MG/ML IJ SOLN
INTRAMUSCULAR | Status: DC | PRN
  Administered 2021-12-17: 10 mg via INTRAVENOUS

## 2021-12-17 MED ORDER — MIDAZOLAM HCL 5 MG/5ML IJ SOLN
INTRAMUSCULAR | Status: DC | PRN
  Administered 2021-12-17: 2 mg via INTRAVENOUS

## 2021-12-17 MED ORDER — CEFAZOLIN SODIUM-DEXTROSE 2-4 GM/100ML-% IV SOLN
2.0000 g | Freq: Three times a day (TID) | INTRAVENOUS | Status: DC
  Administered 2021-12-17 (×2): 2 g via INTRAVENOUS
  Filled 2021-12-17 (×3): qty 100

## 2021-12-17 MED ORDER — PROPOFOL 10 MG/ML IV BOLUS
INTRAVENOUS | Status: DC | PRN
  Administered 2021-12-17: 150 mg via INTRAVENOUS

## 2021-12-17 MED ORDER — SODIUM CHLORIDE 0.9% FLUSH: 3.0000 mL | Freq: Two times a day (BID) | INTRAVENOUS | Status: DC

## 2021-12-17 MED ORDER — CEFAZOLIN SODIUM-DEXTROSE 2-4 GM/100ML-% IV SOLN
INTRAVENOUS | Status: AC
Start: 2021-12-17 — End: ?
  Filled 2021-12-17: qty 100

## 2021-12-17 MED ORDER — HYDROMORPHONE HCL 1 MG/ML IJ SOLN: 1.0000 mg | INTRAMUSCULAR | Status: DC | PRN

## 2021-12-17 MED ORDER — EPHEDRINE SULFATE (PRESSORS) 50 MG/ML IJ SOLN
INTRAMUSCULAR | Status: DC | PRN
  Administered 2021-12-17: 10 mg via INTRAVENOUS

## 2021-12-17 MED ORDER — PROPOFOL 500 MG/50ML IV EMUL
INTRAVENOUS | Status: DC | PRN
  Administered 2021-12-17: 25 ug/kg/min via INTRAVENOUS

## 2021-12-17 MED ORDER — CEFAZOLIN SODIUM-DEXTROSE 2-4 GM/100ML-% IV SOLN
2.0000 g | INTRAVENOUS | Status: AC
  Administered 2021-12-17: 2 g via INTRAVENOUS

## 2021-12-17 MED ORDER — ACETAMINOPHEN 500 MG PO TABS
1000.0000 mg | ORAL_TABLET | Freq: Once | ORAL | Status: AC
  Administered 2021-12-17: 1000 mg via ORAL

## 2021-12-17 MED ORDER — DEXAMETHASONE SODIUM PHOSPHATE 10 MG/ML IJ SOLN
INTRAMUSCULAR | Status: AC
  Filled 2021-12-17: qty 1

## 2021-12-17 MED ORDER — LIDOCAINE HCL (CARDIAC) PF 100 MG/5ML IV SOSY
PREFILLED_SYRINGE | INTRAVENOUS | Status: DC | PRN
  Administered 2021-12-17: 60 mg via INTRAVENOUS

## 2021-12-17 MED ORDER — LIDOCAINE HCL 1 % IJ SOLN
INTRAVENOUS | Status: DC | PRN
  Administered 2021-12-17: 100 mL

## 2021-12-17 MED ORDER — OXYCODONE HCL 5 MG PO TABS: 5.0000 mg | ORAL_TABLET | ORAL | Status: DC | PRN

## 2021-12-17 MED ORDER — POTASSIUM CHLORIDE IN NACL 20-0.45 MEQ/L-% IV SOLN
INTRAVENOUS | Status: DC
  Filled 2021-12-17: qty 1000

## 2021-12-17 MED ORDER — KETOROLAC TROMETHAMINE 15 MG/ML IJ SOLN
15.0000 mg | Freq: Once | INTRAMUSCULAR | Status: AC | PRN
  Administered 2021-12-17: 15 mg via INTRAVENOUS

## 2021-12-17 MED ORDER — IBUPROFEN 200 MG PO TABS
400.0000 mg | ORAL_TABLET | Freq: Four times a day (QID) | ORAL | Status: DC
  Administered 2021-12-17 – 2021-12-18 (×3): 400 mg via ORAL
  Filled 2021-12-17 (×3): qty 2

## 2021-12-17 MED ORDER — AMISULPRIDE (ANTIEMETIC) 5 MG/2ML IV SOLN
INTRAVENOUS | Status: AC
  Filled 2021-12-17: qty 2

## 2021-12-17 MED ORDER — EPINEPHRINE PF 1 MG/ML IJ SOLN
INTRAMUSCULAR | Status: AC
  Filled 2021-12-17: qty 1

## 2021-12-17 MED ORDER — ACETAMINOPHEN 500 MG PO TABS
ORAL_TABLET | ORAL | Status: AC
  Filled 2021-12-17: qty 2

## 2021-12-17 MED ORDER — AMISULPRIDE (ANTIEMETIC) 5 MG/2ML IV SOLN
10.0000 mg | Freq: Once | INTRAVENOUS | Status: DC | PRN
Start: 2021-12-17 — End: 2021-12-18

## 2021-12-17 MED ORDER — ONDANSETRON HCL 4 MG/2ML IJ SOLN: 4.0000 mg | Freq: Once | INTRAMUSCULAR | Status: DC | PRN

## 2021-12-17 MED ORDER — BUPIVACAINE HCL (PF) 0.25 % IJ SOLN
INTRAMUSCULAR | Status: AC
  Filled 2021-12-17: qty 30

## 2021-12-17 MED ORDER — SODIUM CHLORIDE 0.9% FLUSH: 3.0000 mL | INTRAVENOUS | Status: DC | PRN

## 2021-12-17 MED ORDER — POLYETHYLENE GLYCOL 3350 17 G PO PACK: 17.0000 g | PACK | Freq: Every day | ORAL | Status: DC | PRN

## 2021-12-17 MED ORDER — ONDANSETRON HCL 4 MG/2ML IJ SOLN: 4.0000 mg | Freq: Four times a day (QID) | INTRAMUSCULAR | Status: DC | PRN

## 2021-12-17 MED ORDER — ACETAMINOPHEN 325 MG RE SUPP: 650.0000 mg | RECTAL | Status: DC | PRN

## 2021-12-17 MED ORDER — PHENYLEPHRINE 80 MCG/ML (10ML) SYRINGE FOR IV PUSH (FOR BLOOD PRESSURE SUPPORT)
PREFILLED_SYRINGE | INTRAVENOUS | Status: AC
  Filled 2021-12-17: qty 10

## 2021-12-17 MED ORDER — SUCCINYLCHOLINE CHLORIDE 200 MG/10ML IV SOSY
PREFILLED_SYRINGE | INTRAVENOUS | Status: AC
  Filled 2021-12-17: qty 10

## 2021-12-17 MED ORDER — AMISULPRIDE (ANTIEMETIC) 5 MG/2ML IV SOLN
INTRAVENOUS | Status: DC | PRN
  Administered 2021-12-17: 5 mg via INTRAVENOUS

## 2021-12-17 MED ORDER — LIDOCAINE-EPINEPHRINE 1 %-1:100000 IJ SOLN
INTRAMUSCULAR | Status: DC | PRN
  Administered 2021-12-17: 32 mL via INTRAMUSCULAR

## 2021-12-17 MED ORDER — HYDROMORPHONE HCL 1 MG/ML IJ SOLN
0.5000 mg | Freq: Once | INTRAMUSCULAR | Status: AC
  Administered 2021-12-17: 0.5 mg via INTRAVENOUS

## 2021-12-17 MED ORDER — SODIUM CHLORIDE 0.9 % IV SOLN: 250.0000 mL | INTRAVENOUS | Status: DC | PRN

## 2021-12-17 MED ORDER — LIDOCAINE 2% (20 MG/ML) 5 ML SYRINGE
INTRAMUSCULAR | Status: AC
  Filled 2021-12-17: qty 5

## 2021-12-17 MED ORDER — EPHEDRINE 5 MG/ML INJ
INTRAVENOUS | Status: AC
  Filled 2021-12-17: qty 5

## 2021-12-17 MED ORDER — ONDANSETRON HCL 4 MG/2ML IJ SOLN
INTRAMUSCULAR | Status: DC | PRN
  Administered 2021-12-17: 4 mg via INTRAVENOUS

## 2021-12-17 MED ORDER — KETOROLAC TROMETHAMINE 30 MG/ML IJ SOLN
INTRAMUSCULAR | Status: AC
Start: 2021-12-17 — End: ?
  Filled 2021-12-17: qty 1

## 2021-12-17 MED ORDER — DIPHENHYDRAMINE HCL 12.5 MG/5ML PO ELIX: 12.5000 mg | ORAL_SOLUTION | Freq: Four times a day (QID) | ORAL | Status: DC | PRN

## 2021-12-17 MED ORDER — ROCURONIUM BROMIDE 100 MG/10ML IV SOLN
INTRAVENOUS | Status: DC | PRN
  Administered 2021-12-17: 50 mg via INTRAVENOUS

## 2021-12-17 MED ORDER — DIPHENHYDRAMINE HCL 50 MG/ML IJ SOLN: 12.5000 mg | Freq: Four times a day (QID) | INTRAMUSCULAR | Status: DC | PRN

## 2021-12-17 MED ORDER — FENTANYL CITRATE (PF) 100 MCG/2ML IJ SOLN
25.0000 ug | INTRAMUSCULAR | Status: DC | PRN
  Administered 2021-12-17 (×2): 50 ug via INTRAVENOUS

## 2021-12-17 MED ORDER — MIDAZOLAM HCL 2 MG/2ML IJ SOLN
INTRAMUSCULAR | Status: AC
Start: 2021-12-17 — End: ?
  Filled 2021-12-17: qty 2

## 2021-12-17 MED ORDER — MORPHINE SULFATE (PF) 4 MG/ML IV SOLN: 2.0000 mg | INTRAVENOUS | Status: DC | PRN

## 2021-12-17 SURGICAL SUPPLY — 62 items
ADH SKN CLS APL DERMABOND .7 (GAUZE/BANDAGES/DRESSINGS) ×2
APPLIER CLIP 9.375 MED OPEN (MISCELLANEOUS)
APR CLP MED 9.3 20 MLT OPN (MISCELLANEOUS)
BAG DECANTER FOR FLEXI CONT (MISCELLANEOUS) IMPLANT
BINDER ABDOMINAL 10 UNV 27-48 (MISCELLANEOUS) IMPLANT
BINDER ABDOMINAL 12 SM 30-45 (SOFTGOODS) ×2 IMPLANT
BIOPATCH RED 1 DISK 7.0 (GAUZE/BANDAGES/DRESSINGS) IMPLANT
BLADE CLIPPER SURG (BLADE) ×2 IMPLANT
BLADE HEX COATED 2.75 (ELECTRODE) ×2 IMPLANT
BLADE SURG 10 STRL SS (BLADE) ×2 IMPLANT
BLADE SURG 15 STRL LF DISP TIS (BLADE) ×2 IMPLANT
BLADE SURG 15 STRL SS (BLADE) ×4
CANISTER SUCT 1200ML W/VALVE (MISCELLANEOUS) ×2 IMPLANT
CLIP APPLIE 9.375 MED OPEN (MISCELLANEOUS) IMPLANT
COVER BACK TABLE 60X90IN (DRAPES) ×2 IMPLANT
COVER MAYO STAND STRL (DRAPES) ×2 IMPLANT
DERMABOND ADVANCED (GAUZE/BANDAGES/DRESSINGS) ×2
DERMABOND ADVANCED .7 DNX12 (GAUZE/BANDAGES/DRESSINGS) ×2 IMPLANT
DRAIN CHANNEL 19F RND (DRAIN) IMPLANT
DRAPE LAPAROSCOPIC ABDOMINAL (DRAPES) ×2 IMPLANT
DRSG OPSITE POSTOP 4X12 (GAUZE/BANDAGES/DRESSINGS) ×2 IMPLANT
DRSG PAD ABDOMINAL 8X10 ST (GAUZE/BANDAGES/DRESSINGS) ×4 IMPLANT
ELECT BLADE 4.0 EZ CLEAN MEGAD (MISCELLANEOUS)
ELECT REM PT RETURN 9FT ADLT (ELECTROSURGICAL) ×2
ELECTRODE BLDE 4.0 EZ CLN MEGD (MISCELLANEOUS) IMPLANT
ELECTRODE REM PT RTRN 9FT ADLT (ELECTROSURGICAL) ×1 IMPLANT
EVACUATOR SILICONE 100CC (DRAIN) IMPLANT
GAUZE SPONGE 4X4 12PLY STRL (GAUZE/BANDAGES/DRESSINGS) IMPLANT
GLOVE BIO SURGEON STRL SZ 6.5 (GLOVE) ×2 IMPLANT
GOWN STRL REUS W/ TWL LRG LVL3 (GOWN DISPOSABLE) ×2 IMPLANT
GOWN STRL REUS W/TWL LRG LVL3 (GOWN DISPOSABLE) ×4
LINER CANISTER 1000CC FLEX (MISCELLANEOUS) IMPLANT
NDL HYPO 25X1 1.5 SAFETY (NEEDLE) IMPLANT
NDL SAFETY ECLIPSE 18X1.5 (NEEDLE) IMPLANT
NEEDLE HYPO 18GX1.5 SHARP (NEEDLE)
NEEDLE HYPO 25X1 1.5 SAFETY (NEEDLE) IMPLANT
NS IRRIG 1000ML POUR BTL (IV SOLUTION) ×2 IMPLANT
PACK BASIN DAY SURGERY FS (CUSTOM PROCEDURE TRAY) ×2 IMPLANT
PENCIL SMOKE EVACUATOR (MISCELLANEOUS) ×2 IMPLANT
PIN SAFETY STERILE (MISCELLANEOUS) ×2 IMPLANT
SLEEVE SCD COMPRESS KNEE MED (STOCKING) ×2 IMPLANT
SPONGE T-LAP 18X18 ~~LOC~~+RFID (SPONGE) ×4 IMPLANT
STRIP SUTURE WOUND CLOSURE 1/2 (MISCELLANEOUS) ×4 IMPLANT
SUT MNCRL AB 4-0 PS2 18 (SUTURE) ×7 IMPLANT
SUT MON AB 3-0 SH 27 (SUTURE) ×14
SUT MON AB 3-0 SH27 (SUTURE) ×2 IMPLANT
SUT MON AB 5-0 PS2 18 (SUTURE) IMPLANT
SUT PDS 3-0 CT2 (SUTURE) ×10
SUT PDS II 3-0 CT2 27 ABS (SUTURE) ×5 IMPLANT
SUT SILK 2 0 SH (SUTURE) ×2 IMPLANT
SUT VICRYL 4-0 PS2 18IN ABS (SUTURE) ×4 IMPLANT
SYR 50ML LL SCALE MARK (SYRINGE) IMPLANT
SYR BULB IRRIG 60ML STRL (SYRINGE) ×2 IMPLANT
SYR CONTROL 10ML LL (SYRINGE) ×1 IMPLANT
SYR TB 1ML LL NO SAFETY (SYRINGE) IMPLANT
TOWEL GREEN STERILE FF (TOWEL DISPOSABLE) ×4 IMPLANT
TRAY DSU PREP LF (CUSTOM PROCEDURE TRAY) ×2 IMPLANT
TUBE CONNECTING 20X1/4 (TUBING) ×2 IMPLANT
TUBING INFILTRATION IT-10001 (TUBING) ×1 IMPLANT
TUBING SET GRADUATE ASPIR 12FT (MISCELLANEOUS) ×1 IMPLANT
UNDERPAD 30X36 HEAVY ABSORB (UNDERPADS AND DIAPERS) ×4 IMPLANT
YANKAUER SUCT BULB TIP NO VENT (SUCTIONS) ×2 IMPLANT

## 2021-12-17 NOTE — Transfer of Care (Signed)
Immediate Anesthesia Transfer of Care Note  Patient: Regina Daniels  Procedure(s) Performed: PANNICULECTOMY (Abdomen)  Patient Location: PACU  Anesthesia Type:General  Level of Consciousness: awake, alert , oriented, drowsy and patient cooperative  Airway & Oxygen Therapy: Patient Spontanous Breathing and Patient connected to face mask oxygen  Post-op Assessment: Report given to RN and Post -op Vital signs reviewed and stable  Post vital signs: Reviewed and stable  Last Vitals:  Vitals Value Taken Time  BP 149/84 12/17/21 1025  Temp    Pulse 88 12/17/21 1026  Resp 16 12/17/21 1026  SpO2 100 % 12/17/21 1026  Vitals shown include unvalidated device data.  Last Pain:  Vitals:   12/17/21 0639  TempSrc: Oral  PainSc: 0-No pain      Patients Stated Pain Goal: 4 (12/17/21 1540)  Complications: No notable events documented.

## 2021-12-17 NOTE — Anesthesia Postprocedure Evaluation (Signed)
Anesthesia Post Note  Patient: Regina Daniels  Procedure(s) Performed: PANNICULECTOMY (Abdomen)     Patient location during evaluation: PACU Anesthesia Type: General Level of consciousness: awake Pain management: pain level controlled Vital Signs Assessment: post-procedure vital signs reviewed and stable Respiratory status: spontaneous breathing, nonlabored ventilation, respiratory function stable and patient connected to nasal cannula oxygen Cardiovascular status: blood pressure returned to baseline and stable Postop Assessment: no apparent nausea or vomiting Anesthetic complications: no   No notable events documented.  Last Vitals:  Vitals:   12/17/21 1700 12/17/21 1930  BP: (!) 127/58 (!) 122/44  Pulse: (!) 57 (!) 56  Resp: 16 16  Temp: 36.6 C 36.8 C  SpO2: 98% 97%    Last Pain:  Vitals:   12/17/21 1930  TempSrc:   PainSc: 0-No pain                 Raelie Lohr P Arrayah Connors

## 2021-12-17 NOTE — Anesthesia Procedure Notes (Signed)

## 2021-12-17 NOTE — Discharge Instructions (Addendum)
INSTRUCTIONS FOR AFTER ABDOMINAL SURGERY  You will likely have some questions about what to expect following your operation.  The following information will help you and your family understand what to expect when you get home.  Following these guidelines will help ensure a smooth recovery and reduce risks of complications.  Postoperative instructions include information on: diet, wound care, medications and physical activity.  AFTER SURGERY Expect to go home after the procedure.  In some cases, you may need to spend one night in the hospital for observation.  DIET This surgery does not require a specific diet.  However, the healthier you eat the better your body can heal. It is important to increasing your protein intake.  Limit foods with high sugar and  carbohydrate content.  Focus on vegetables, meat and other protein sources if you are vegan or vegetarian.  If you undergo liposuction during your procedure it is very important to drink 8 oz of water every hour while awake for 2 days.  If your urine is bright yellow, then it is concentrated, and you need to drink more water.  If you find you are persistently nauseated or unable to take in liquids let us know.  NO TOBACCO USE or EXPOSURE.  This will slow your healing process and increase the risk of a wound.  WOUND CARE Leave the abdominal binder in place for 3 days.  Then you can remove it and shower.  Replace the binder or spanx after your shower.   You may have Topifoam or Lipofoam on.  It is soft and spongy and helps keep you from getting creases if you have liposuction.  This can be removed before the shower and then replaced.  If you need more it is available on [place] as lipofoam.  If you have steri-strips / tape directly attached to your skin leave them in place. It is OK to get these wet.  No baths, pools or hot tubs for four weeks. We close your incision to leave the smallest and best-looking scar. No ointment or creams on your incisions  until cleared by your surgeon.  No Neosporin (Too many skin reactions with this one).  After the steri-strips are off can use Mederma or Skinuva and start massaging the scar. Continue to wear the binder/spanx or Ace wrap around the clock, including while sleeping, for 6 weeks. This provides added comfort and helps reduce the fluid accumulation at the surgery site.  ACTIVITY No heavy lifting until cleared by the doctor.  For example, no more than a half-gallon of milk.  It is OK to walk and you are encouraged to move your legs to help decrease your risk of getting a blood clot.  It will also help keep you from getting deconditioned.  Every 1 to 2 hours get up and walk for 5 minutes. This will help with a quicker recovery back to normal.  Let pain be your guide so you don't do too much.     SLEEPING / RESTING Sleeping and resting should be in the jack-knife or bent forward position with your head elevated.  This will help reduce pulling on your abdominal incision.  You can elevate your head and upper back with a few pillows and place a pillow under your knees.  Avoid stomach sleeping for 3 months.   WORK Everyone returns to work at different times. As a rough guide, most people take 1 - 2 weeks off prior to returning to work. If you need documentation for your  job give them to the front staff for processing.  DRIVING Arrange for someone to bring you home from the hospital.  You may be able to drive a few days after surgery but not while taking any narcotics or valium.  This is for your safety as well as others sharing the road with you.  BOWEL MOVEMENTS Constipation can occur after anesthesia and while taking pain medication.  It is important to stay ahead for your comfort.  We recommend taking Milk of Magnesia (2 tablespoons; twice a day) while taking the pain pills.  MEDICATIONS (you may receive and should be started after surgery) At your preoperative visit for you history and physical you were  given the following medications: Antibiotic: Start this medication when you get home and take according to the instructions on the bottle. Zofran 4 mg:  This is to treat nausea and vomiting.  You can take this every 6 hours as needed and only if needed. Norco (hydrocodone/acetaminophen) 5/325 mg:  This is only to be used after you have taken the Motrin or the Tylenol. Every 8 hours as needed.  Over the counter Medication to take: Ibuprofen (Motrin) 600 mg:  Take this every 6 hours.  If you have additional pain then take 500 mg of the Tylenol.  Only take the Norco after you have tried these two. MiraLAX or stool softener of choice: Take this according to the bottle if you take the [place] Call your surgeon's office if any of the following occur:  Fever 101 degrees F or greater  Excessive bleeding or fluid from the incision site.  Pain that increases over time without aid from the medications  Redness, warmth, or pus draining from incision sites  Persistent nausea or inability to take in liquids  Severe misshapen area that underwent the operation.   Post Anesthesia Home Care Instructions  Activity: Get plenty of rest for the remainder of the day. A responsible individual must stay with you for 24 hours following the procedure.  For the next 24 hours, DO NOT: -Drive a car -Paediatric nurse -Drink alcoholic beverages -Take any medication unless instructed by your physician -Make any legal decisions or sign important papers.  Meals: Start with liquid foods such as gelatin or soup. Progress to regular foods as tolerated. Avoid greasy, spicy, heavy foods. If nausea and/or vomiting occur, drink only clear liquids until the nausea and/or vomiting subsides. Call your physician if vomiting continues.  Special Instructions/Symptoms: Your throat may feel dry or sore from the anesthesia or the breathing tube placed in your throat during surgery. If this causes discomfort, gargle  with warm salt water. The discomfort should disappear within 24 hours.  If you had a scopolamine patch placed behind your ear for the management of post- operative nausea and/or vomiting:  1. The medication in the patch is effective for 72 hours, after which it should be removed.  Wrap patch in a tissue and discard in the trash. Wash hands thoroughly with soap and water. 2. You may remove the patch earlier than 72 hours if you experience unpleasant side effects which may include dry mouth, dizziness or visual disturbances. 3. Avoid touching the patch. Wash your hands with soap and water after contact with the patch.

## 2021-12-17 NOTE — OR Nursing (Signed)
Pannus tissue discarded per Dr. Ulice Bold.

## 2021-12-17 NOTE — Op Note (Signed)
Operative Report  Date of operation: 12/17/2021  Patient: Regina Daniels, MRN: [id number], 66 y.o. female.   Date of birth: April 13, 1956  Location: [facility]  Preoperative Diagnosis: Panniculitis  Postoperative Diagnosis:  Same  Procedure: Panniculectomy  Surgeon:  Wayland Denis Jesusa Stenerson  Assistant:  Caroline More, PA  Anesthesia:  General  EBL:  100cc  Drains:  2 number 19 blake round drains  Condition:  Stable  Complications: None  Disposition: Recovery Room  Procedure in Detail: Patient was seen the morning of her surgery and marked out for the procedure. She was then given an IV and IV antibiotics. The patient was taken to the operating room and underwent general anesthesia.  A time out was called and all information was confirmed to be correct. SCD's and a pillow under the knees was in place. The patient was then prepped and draped in the standard sterile fashion. Local was placed into the incision and 2 stab incisions made through which 100 cc of tumescent was placed in each flank area. The flanks were liposuctioned and 100 cc removed from the sides. The planned lower incision was then incised and the incision taken down through the Scarpa's fascia to the rectus abdominus fascia. The skin and subcutaneous tissue was then lifted off the fascia up to the level of the umbilicus. The umbilicus is then circumscribed and freed up from the surrounding skin and freed down to the abdominal wall. The abdominal wall was then freed above the umbilicus but not widely up to a few centimeters above the umbilicus.  The patient was then flexed on the table and the amount that could be excised was confirmed. The pannus was excised and it weighed 1100 grams. The mons was also suspended with 3-0 Monocryl.  Two #19 blake round drains were placed and secured with 3-0 Silk. The patient was put back in the flexed position and the closure begun. The new position of the umbilicus  was determined and a small v shaped incision placed in the abdominal wall. The umbilicus was inset from the dermis to the abdominal wall and then run closed with 5-0 monocryl in a subcuticular fashion. The abdominal wall was closed with buried 3-0 PDS and 3-0 Monocryl and subcuticular 4-0 Monocryl.    The wound was then dressed with dermabond. ABD's and an abdominal binder were placed. Patient was allowed to wake up, extubated and taken on a stretcher in the flexed position to the recovery room. Family was notified at the end of the case.   The advanced practice practitioner (APP) assisted throughout the case.  The APP was essential in retraction and counter traction when needed to make the case progress smoothly.  This retraction and assistance made it possible to see the tissue plans for the procedure.  The assistance was needed for blood control, tissue re-approximation and assisted with closure of the incision site.

## 2021-12-17 NOTE — Interval H&P Note (Signed)
History and Physical Interval Note:  12/17/2021 7:04 AM  Regina Daniels  has presented today for surgery, with the diagnosis of Panniculitis.  The various methods of treatment have been discussed with the patient and family. After consideration of risks, benefits and other options for treatment, the patient has consented to  Procedure(s) with comments: PANNICULECTOMY (N/A) - 4 hours as a surgical intervention.  The patient's history has been reviewed, patient examined, no change in status, stable for surgery.  I have reviewed the patient's chart and labs.  Questions were answered to the patient's satisfaction.     Loel Lofty Glenda Kunst

## 2021-12-18 ENCOUNTER — Emergency Department (HOSPITAL_COMMUNITY): Payer: Medicare Other

## 2021-12-18 ENCOUNTER — Observation Stay (HOSPITAL_BASED_OUTPATIENT_CLINIC_OR_DEPARTMENT_OTHER)
Admission: EM | Admit: 2021-12-18 | Discharge: 2021-12-20 | Disposition: A | Discharge status: Home / Self Care | Payer: Medicare Other | Source: Home / Self Care | Attending: Emergency Medicine | Admitting: Emergency Medicine

## 2021-12-18 ENCOUNTER — Other Ambulatory Visit: Payer: Self-pay

## 2021-12-18 ENCOUNTER — Encounter (HOSPITAL_BASED_OUTPATIENT_CLINIC_OR_DEPARTMENT_OTHER): Payer: Self-pay | Admitting: Plastic Surgery

## 2021-12-18 DIAGNOSIS — I10 Essential (primary) hypertension: Secondary | ICD-10-CM | POA: Insufficient documentation

## 2021-12-18 DIAGNOSIS — E039 Hypothyroidism, unspecified: Secondary | ICD-10-CM

## 2021-12-18 DIAGNOSIS — N179 Acute kidney failure, unspecified: Secondary | ICD-10-CM | POA: Insufficient documentation

## 2021-12-18 DIAGNOSIS — Z7984 Long term (current) use of oral hypoglycemic drugs: Secondary | ICD-10-CM | POA: Insufficient documentation

## 2021-12-18 DIAGNOSIS — Z79899 Other long term (current) drug therapy: Secondary | ICD-10-CM | POA: Insufficient documentation

## 2021-12-18 DIAGNOSIS — R55 Syncope and collapse: Secondary | ICD-10-CM | POA: Insufficient documentation

## 2021-12-18 DIAGNOSIS — Z8739 Personal history of other diseases of the musculoskeletal system and connective tissue: Secondary | ICD-10-CM

## 2021-12-18 DIAGNOSIS — Z96612 Presence of left artificial shoulder joint: Secondary | ICD-10-CM | POA: Insufficient documentation

## 2021-12-18 DIAGNOSIS — R109 Unspecified abdominal pain: Secondary | ICD-10-CM | POA: Insufficient documentation

## 2021-12-18 DIAGNOSIS — D649 Anemia, unspecified: Secondary | ICD-10-CM | POA: Insufficient documentation

## 2021-12-18 DIAGNOSIS — L732 Hidradenitis suppurativa: Secondary | ICD-10-CM | POA: Diagnosis present

## 2021-12-18 LAB — HEPATIC FUNCTION PANEL
ALT: 11 U/L (ref 0–44)
AST: 18 U/L (ref 15–41)
Albumin: 3 g/dL — ABNORMAL LOW (ref 3.5–5.0)
Alkaline Phosphatase: 51 U/L (ref 38–126)
Bilirubin, Direct: 0.1 mg/dL (ref 0.0–0.2)
Indirect Bilirubin: 0.3 mg/dL (ref 0.3–0.9)
Total Bilirubin: 0.4 mg/dL (ref 0.3–1.2)
Total Protein: 5.7 g/dL — ABNORMAL LOW (ref 6.5–8.1)

## 2021-12-18 LAB — URINALYSIS, ROUTINE W REFLEX MICROSCOPIC
Bilirubin Urine: NEGATIVE
Glucose, UA: NEGATIVE mg/dL
Hgb urine dipstick: NEGATIVE
Ketones, ur: NEGATIVE mg/dL
Leukocytes,Ua: NEGATIVE
Nitrite: NEGATIVE
Protein, ur: NEGATIVE mg/dL
Specific Gravity, Urine: 1.004 — ABNORMAL LOW (ref 1.005–1.030)
pH: 5 (ref 5.0–8.0)

## 2021-12-18 LAB — CBC
HCT: 32 % — ABNORMAL LOW (ref 36.0–46.0)
HCT: 28.4 % — ABNORMAL LOW (ref 36.0–46.0)
HCT: 29.2 % — ABNORMAL LOW (ref 36.0–46.0)
Hemoglobin: 10.2 g/dL — ABNORMAL LOW (ref 12.0–15.0)
Hemoglobin: 8.9 g/dL — ABNORMAL LOW (ref 12.0–15.0)
Hemoglobin: 9.4 g/dL — ABNORMAL LOW (ref 12.0–15.0)
MCH: 29.9 pg (ref 26.0–34.0)
MCH: 29.9 pg (ref 26.0–34.0)
MCH: 30.2 pg (ref 26.0–34.0)
MCHC: 31.9 g/dL (ref 30.0–36.0)
MCHC: 31.3 g/dL (ref 30.0–36.0)
MCHC: 32.2 g/dL (ref 30.0–36.0)
MCV: 93.8 fL (ref 80.0–100.0)
MCV: 95.3 fL (ref 80.0–100.0)
MCV: 93.9 fL (ref 80.0–100.0)
Platelets: 265 10*3/uL (ref 150–400)
Platelets: 226 10*3/uL (ref 150–400)
Platelets: 234 10*3/uL (ref 150–400)
RBC: 3.41 MIL/uL — ABNORMAL LOW (ref 3.87–5.11)
RBC: 2.98 MIL/uL — ABNORMAL LOW (ref 3.87–5.11)
RBC: 3.11 MIL/uL — ABNORMAL LOW (ref 3.87–5.11)
RDW: 13.9 % (ref 11.5–15.5)
RDW: 14.1 % (ref 11.5–15.5)
RDW: 14.4 % (ref 11.5–15.5)
WBC: 9 10*3/uL (ref 4.0–10.5)
WBC: 8.1 10*3/uL (ref 4.0–10.5)
WBC: 8 10*3/uL (ref 4.0–10.5)
nRBC: 0 % (ref 0.0–0.2)
nRBC: 0 % (ref 0.0–0.2)
nRBC: 0 % (ref 0.0–0.2)

## 2021-12-18 LAB — BASIC METABOLIC PANEL
Anion gap: 7 (ref 5–15)
BUN: 14 mg/dL (ref 8–23)
CO2: 24 mmol/L (ref 22–32)
Calcium: 8.9 mg/dL (ref 8.9–10.3)
Chloride: 108 mmol/L (ref 98–111)
Creatinine, Ser: 1.13 mg/dL — ABNORMAL HIGH (ref 0.44–1.00)
GFR, Estimated: 54 mL/min — ABNORMAL LOW (ref >60)
Glucose, Bld: 109 mg/dL — ABNORMAL HIGH (ref 70–99)
Potassium: 4.1 mmol/L (ref 3.5–5.1)
Sodium: 139 mmol/L (ref 135–145)

## 2021-12-18 LAB — LACTIC ACID, PLASMA
Lactic Acid, Venous: 1.3 mmol/L (ref 0.5–1.9)
Lactic Acid, Venous: 1.2 mmol/L (ref 0.5–1.9)

## 2021-12-18 LAB — TYPE AND SCREEN
ABO/RH(D): O POS
Antibody Screen: NEGATIVE

## 2021-12-18 LAB — CBG MONITORING, ED: Glucose-Capillary: 105 mg/dL — ABNORMAL HIGH (ref 70–99)

## 2021-12-18 LAB — ABO/RH: ABO/RH(D): O POS

## 2021-12-18 MED ORDER — ACETAMINOPHEN 325 MG PO TABS
650.0000 mg | ORAL_TABLET | Freq: Four times a day (QID) | ORAL | Status: DC | PRN
  Administered 2021-12-19: 650 mg via ORAL
  Filled 2021-12-18: qty 2

## 2021-12-18 MED ORDER — ACETAMINOPHEN 325 MG PO TABS
650.0000 mg | ORAL_TABLET | Freq: Once | ORAL | Status: AC
Start: 2021-12-18 — End: 2021-12-18
  Administered 2021-12-18: 650 mg via ORAL
  Filled 2021-12-18: qty 2

## 2021-12-18 MED ORDER — HYDROCODONE-ACETAMINOPHEN 5-325 MG PO TABS
1.0000 | ORAL_TABLET | Freq: Four times a day (QID) | ORAL | Status: DC | PRN
  Administered 2021-12-19 – 2021-12-20 (×4): 1 via ORAL
  Filled 2021-12-18 (×4): qty 1

## 2021-12-18 MED ORDER — SODIUM CHLORIDE 0.9 % IV BOLUS
1000.0000 mL | Freq: Once | INTRAVENOUS | Status: AC
  Administered 2021-12-18: 1000 mL via INTRAVENOUS

## 2021-12-18 MED ORDER — OXYCODONE HCL 5 MG PO TABS
5.0000 mg | ORAL_TABLET | Freq: Once | ORAL | Status: AC
  Administered 2021-12-18: 5 mg via ORAL
  Filled 2021-12-18: qty 1

## 2021-12-18 MED ORDER — LACTATED RINGERS IV BOLUS
1000.0000 mL | Freq: Once | INTRAVENOUS | Status: AC
  Administered 2021-12-18: 1000 mL via INTRAVENOUS

## 2021-12-18 MED ORDER — IOHEXOL 300 MG/ML  SOLN
100.0000 mL | Freq: Once | INTRAMUSCULAR | Status: AC | PRN
  Administered 2021-12-18: 100 mL via INTRAVENOUS

## 2021-12-18 MED ORDER — LACTATED RINGERS IV SOLN
INTRAVENOUS | Status: AC
Start: 2021-12-18 — End: 2021-12-19

## 2021-12-18 NOTE — H&P (Addendum)
History and Physical    Patient: Regina Daniels DOB: 03/17/1956 DOA: 12/18/2021 DOS: the patient was seen and examined on 12/19/2021 PCP: Burton Apleyoberts, Ronald, MD  Patient coming from: Home  Chief Complaint:  Chief Complaint  Patient presents with   Loss of Consciousness   HPI: Regina Daniels is a 66 y.o. female with medical history significant of History of panniculitis s/p panniculectomy, hidradenitis suppurativa, hypothyroidism, hyperlipidemia who presents with concerns of syncope.  She is postop day 1 from panniculectomy by plastic surgery Dr. Ulice Boldillingham.  After returning home she has been feeling dizzy with ambulation.  Today she again felt dizzy when standing at her sink and then had her sister assist her to a chair where she had brief loss of consciousness.  She reports issues with low blood pressure for the past 2 weeks.  She was recently switched from triamterene-HCTZ to lisinopril-HCTZ 10 mg - 12.5 mg due to AKI.  She was advised to only take it if her systolic goes above 120.  She took it about 3 days ago since her BP was in the 130s.  Also had her spironolactone increased from 50 mg to 100 mg 3 days ago as well.  She has noticed some orthostatic hypotension at home for the past few days. Has decreased oral intake surrounding her recent surgery.  In the ED, she had soft BP of 100 over 50s on room air.  No leukocytosis, anemia of 10.2 with no recent prior for comparison.  Creatinine of 1.13 which is improved from 1.27 on 5/23.  CT of the abdomen and pelvis showed diffuse subcutaneous soft tissue edema and emphysema likely postsurgical in etiology.  There is hematoma versus seroma seen along the surgical drain.  No fluid collection to suggest abscess.  ED PA spoke with plastic surgeon Dr. Ulice Boldillingham who reviewed imaging and agrees with likely postoperative changes.   Review of Systems: As mentioned in the history of present illness. All other systems reviewed and are  negative. Past Medical History:  Diagnosis Date   Allergy    Angio-edema    Arthritis    back   Chronic headaches    Sinus   Dysrhythmia    palpitations   GERD (gastroesophageal reflux disease)    Groin abscess    Hidradenitis suppurativa    reason she is on metformin and spironolactone   Hypertension    since age 66   Hypothyroidism    PONV (postoperative nausea and vomiting)    Recurrent upper respiratory infection (URI)    Sleep difficulties    Thyroid disease    hypothyroidism   Past Surgical History:  Procedure Laterality Date   ABDOMINAL HYSTERECTOMY  1993   BREAST SURGERY  1993 and 1994   Lt lumpectomy   INCISE AND DRAIN ABCESS     abscess left groin   INGUINAL HIDRADENITIS EXCISION  2012   bil   PANNICULECTOMY N/A 12/17/2021   Procedure: PANNICULECTOMY;  Surgeon: Peggye Formillingham, Claire S, DO;  Location: [facility];  Service: Plastics;  Laterality: N/A;  4 hours   SHOULDER ACROMIOPLASTY Left 04/09/2015   Procedure: SHOULDER ACROMIOPLASTY;  Surgeon: Frederico Hammananiel Caffrey, MD;  Location: [facility];  Service: Orthopedics;  Laterality: Left;   SHOULDER ARTHROSCOPY WITH OPEN ROTATOR CUFF REPAIR Left 04/09/2015   Procedure: LEFT SHOULDER ARTHROSCOPY DEBRIDEMENT ,  with open acromioplasty and rotator cuff repair;  Surgeon: Frederico Hammananiel Caffrey, MD;  Location: [facility];  Service: Orthopedics;  Laterality: Left;  SINOSCOPY     TEMPOROMANDIBULAR JOINT SURGERY  2002   TUBAL LIGATION     Social History:  reports that she has never smoked. She has never used smokeless tobacco. She reports current alcohol use. She reports that she does not use drugs.  Allergies  Allergen Reactions   Meperidine Hcl Hives   Other Other (See Comments)    Steroid injections-headaches   Prednisone Other (See Comments)    Headaches    Sudafed [Pseudoephedrine Hcl]     Pt stated, "Makes me feel wired and keeps me up all night"   Demerol Hives, Rash and Other (See  Comments)    All-over body hives   Epinephrine Palpitations    Family History  Problem Relation Age of Onset   Heart disease Mother    Diabetes Mother    Stroke Mother    Thyroid disease Mother    Cancer Father    Heart disease Father    Diabetes Father    Stroke Father    Thyroid disease Sister    Asthma Sister    Asthma Brother    Asthma Brother    Asthma Brother    Asthma Sister    Asthma Paternal Uncle     Prior to Admission medications   Medication Sig Start Date End Date Taking? Authorizing Provider  acetaminophen (TYLENOL) 500 MG tablet Take 500-1,000 mg by mouth every 6 (six) hours as needed for mild pain or headache.   Yes [provider]  acyclovir ointment (ZOVIRAX) 5 % Apply 1 application. topically daily as needed (AS DIRECTED- TO AFFECTED SITE). 11/02/19  Yes [provider]  cetirizine (ZYRTEC) 10 MG tablet Take 10 mg by mouth at bedtime.   Yes [provider]  clindamycin (CLEOCIN T) 1 % external solution Apply 1 application. topically daily as needed (AS DIRECTED- TO AFFECTED SITE). 10/03/20  Yes [provider]  fluticasone (FLONASE) 50 MCG/ACT nasal spray Place 1 spray into both nostrils daily. 07/01/17  Yes [provider]  HYDROcodone-acetaminophen (NORCO/VICODIN) 5-325 MG tablet Take 1 tablet by mouth every 6 (six) hours as needed for moderate pain.   Yes [provider]  ipratropium (ATROVENT) 0.06 % nasal spray Apply 1 to 2 sprays in each nostril twice a day as needed for runny nose. Patient taking differently: Place 1-2 sprays into both nostrils 2 (two) times daily as needed for rhinitis. 12/10/20  Yes Ambs, Norvel Richards, FNP  ketoconazole (NIZORAL) 2 % cream Apply 1 application. topically daily as needed for irritation (AS DIRECTED- TO AFFECTED SITE).   Yes [provider]  levothyroxine (SYNTHROID, LEVOTHROID) 50 MCG tablet Take 50 mcg by mouth daily before breakfast.   Yes [provider]   lisinopril-hydrochlorothiazide (ZESTORETIC) 10-12.5 MG tablet Take 1 tablet by mouth daily. 11/26/21  Yes [provider]  ondansetron (ZOFRAN-ODT) 4 MG disintegrating tablet Take 1 tablet (4 mg total) by mouth every 8 (eight) hours as needed for nausea or vomiting. Patient taking differently: Take 4 mg by mouth every 8 (eight) hours as needed for nausea or vomiting (dissolve orally). 11/30/21  Yes Lorelee New, PA-C  pantoprazole (PROTONIX) 40 MG tablet Take 40 mg by mouth daily before breakfast.   Yes [provider]  potassium chloride (KLOR-CON) 10 MEQ tablet Take 10 mEq by mouth daily.   Yes [provider]  rosuvastatin (CRESTOR) 10 MG tablet Take 10 mg by mouth at bedtime. 10/24/19  Yes [provider]  spironolactone (ALDACTONE) 100 MG tablet  Take 100 mg by mouth at bedtime.   Yes [provider]  estradiol (ESTRACE) 1 MG tablet TK 1 T PO QD Patient not taking: Reported on 12/18/2021 08/29/18   [provider]    Physical Exam: Vitals:   12/18/21 1915 12/18/21 1930 12/18/21 2000 12/18/21 2225  BP: (!) 109/47 (!) 90/46 (!) 109/47 (!) 105/57  Pulse: 63 64 78 (!) 57  Resp: 15 18 (!) 28 16  Temp:    98.7 F (37.1 C)  TempSrc:    Oral  SpO2: 100% 97% 100% 100%  Weight:    88.3 kg  Height:       Constitutional: NAD, calm, comfortable, obese female laying upright in bed Eyes:  lids and conjunctivae normal ENMT: Mucous membranes are moist.  Neck: normal, supple, Respiratory: clear to auscultation bilaterally, no wheezing, no crackles. Normal respiratory effort. No accessory muscle use.  Cardiovascular: Regular rate and rhythm, no murmurs / rubs / gallops.  Trace bilateral lower extremity edema. Abdomen: Soft, nontender nondistended with abdominal binder and bilateral surgical drain with serosanguineous fluid.  Bowel sounds positive.  Musculoskeletal: no clubbing / cyanosis. No joint deformity upper and lower extremities. Good ROM, no  contractures. Normal muscle tone.  Skin: no rashes, lesions, ulcers. No induration Neurologic: CN 2-12 grossly intact. Strength 5/5 in all 4.  Psychiatric: Normal judgment and insight. Alert and oriented x 3. Normal mood. Data Reviewed:  See HPI  Assessment and Plan: * Syncope Suspect secondary to hypotension related to her antihypertensives and recent decreased oral intake surrounding her abdominal surgery. -Has been taking lisinopril-HCTZ as needed and recently took a dose along with an increase in her spironolactone from 50 mg to 100 mg 3 days ago. -Hold all home antihypertensives.   -We will continue to hydrate with IV fluids overnight and will clinicically assess tomorrow  Anemia Normocytic anemia. Hgb of 10.2.  Possible from recent surgery but no prior for comparison. Will check vitamin b12 and folic.   AKI (acute kidney injury) (HCC) Creatinine mildly elevated to 1.13 although this is improved from 1.27 several weeks ago.  Monitor with repeat lab in the morning after IV fluids overnight.  Hypothyroidism Continue levothyroxine  History of panniculitis S/p panniculectomy on 6/1 with plastic surgeon Dr. Ulice Bold -Asymptomatic from an abdominal standpoint.  Continue surgical drain care daily.  We will measure drain output daily.  Hidradenitis Hold on spironolactone due to soft blood pressure      Advance Care Planning:   Code Status: Full Code   Consults: curbsided plastic surgery in ED  Family Communication: Granddaughters at bedside  Severity of Illness: The appropriate patient status for this patient is OBSERVATION. Observation status is judged to be reasonable and necessary in order to provide the required intensity of service to ensure the patient's safety. The patient's presenting symptoms, physical exam findings, and initial radiographic and laboratory data in the context of their medical condition is felt to place them at decreased risk for further clinical  deterioration. Furthermore, it is anticipated that the patient will be medically stable for discharge from the hospital within 2 midnights of admission.   Author: Anselm Jungling, DO 12/19/2021 12:17 AM  For on call review [email or web address].

## 2021-12-18 NOTE — ED Triage Notes (Signed)
Pt arrived to ED via EMS from home w/ c/o syncope. Pt was discharged from the surgical center this morning after having abd surgery. Pt has bilateral JP drains. Pt reported that she went to stand up to go to the bathroom and felt dizzy and then passed out. Initial BP 91/51 and HR 108. EMS reports pt was clammy and now is dry to touch. BP improved to 118/60 and the last BP was 99/55 and HR in the 60's. EMS started 20g L AC and gave NS bolus. Pt takes lisinopril every other day d/t hypotension and took it when she got home today. (Today would be her normal day to take lisinopril).

## 2021-12-18 NOTE — Discharge Summary (Signed)
Physician Discharge Summary  Patient ID: Regina Daniels MRN: [id number] DOB/AGE: 1956-06-21 66 y.o.  Admit date: 12/17/2021 Discharge date: 12/18/2021  Admission Diagnoses: Panniculitis   Discharge Diagnoses:  Principal Problem:   Panniculitis   Discharged Condition: good  Hospital Course: Patient is a 66 year old female who presented to [facility] yesterday for panniculectomy with Dr. Ulice Bold.  She is postop day 1.  Patient admitted overnight for observation and pain control.  Today, patient reports she is doing well.  She denies any acute events overnight.  She reports her pain has been overall fairly controlled with Tylenol and ibuprofen.  She reports she has been eating and drinking without issue.  She denies nausea or vomiting.  She reports she is voiding without issue.  Patient reports that she is ready to go home.   Consults: None  Significant Diagnostic Studies: n/a  Treatments: analgesia: acetaminophen and ibuprofen   Discharge Exam: Blood pressure 121/69, pulse 61, temperature 98.2 F (36.8 C), resp. rate 18, height 5' 5.5" (1.664 m), weight 86.5 kg, SpO2 100 %. General appearance: alert, cooperative, and no distress Resp: unlabored breathing, no respiratory distress GI: Incisions intact with steri strips. Honeycomb dressings in place are clean, dry and intact. Abdomen is soft with very mild tenderness to palpation. Drains are secured in place bilaterally and are functioning. Approximately 25cc of bloody and serosanguineous drainage in each bulb.  Extremities: no lower extremity edema or tenderness  Disposition: Discharge disposition: 01-Home or Self Care     Discussed with patient to continue to wear the abdominal binder at all times with the exception of showering. Discussed with patient she may shower in a few days. Discussed with patient to sleep with head of elevated with knees slightly bent. Patient acknowledged.   Told patient to call us if  drains are putting out copious amounts. Patient acknowledged.   Instructed patient to call if she had any questions or concerns.   Discussed plan and objective findings with Dr. Ulice Bold.  Discharge Instructions     (HEART FAILURE PATIENTS) Call MD:  Anytime you have any of the following symptoms: 1) 3 pound weight gain in 24 hours or 5 pounds in 1 week 2) shortness of breath, with or without a dry hacking cough 3) swelling in the hands, feet or stomach 4) if you have to sleep on extra pillows at night in order to breathe.   Complete by: As directed    Call MD for:  difficulty breathing, headache or visual disturbances   Complete by: As directed    Call MD for:  extreme fatigue   Complete by: As directed    Call MD for:  hives   Complete by: As directed    Call MD for:  persistant dizziness or light-headedness   Complete by: As directed    Call MD for:  persistant nausea and vomiting   Complete by: As directed    Call MD for:  redness, tenderness, or signs of infection (pain, swelling, redness, odor or green/yellow discharge around incision site)   Complete by: As directed    Call MD for:  severe uncontrolled pain   Complete by: As directed    Call MD for:  temperature >100.4   Complete by: As directed    Diet - low sodium heart healthy   Complete by: As directed    Increase activity slowly   Complete by: As directed       Allergies as of 12/18/2021  Reactions   Meperidine Hcl Hives   Other    Steroid injections-headaches   Prednisone Other (See Comments)   headache   Sudafed [pseudoephedrine Hcl]    Pt stated, "Makes me feel wired and keeps me up all night"   Demerol Hives, Rash   All over body   Epinephrine Palpitations, Other (See Comments)        Medication List     TAKE these medications    acyclovir ointment 5 % Commonly known as: ZOVIRAX Apply topically See admin instructions.   cetirizine 10 MG tablet Commonly known as: ZYRTEC Take 10 mg by  mouth daily.   clindamycin 1 % external solution Commonly known as: CLEOCIN T Apply topically daily.   estradiol 1 MG tablet Commonly known as: ESTRACE TK 1 T PO QD   fluticasone 50 MCG/ACT nasal spray Commonly known as: FLONASE Place into the nose.   ipratropium 0.06 % nasal spray Commonly known as: ATROVENT Apply 1 to 2 sprays in each nostril twice a day as needed for runny nose.   ketoconazole 2 % cream Commonly known as: NIZORAL Apply 1 application. topically daily.   levothyroxine 50 MCG tablet Commonly known as: SYNTHROID Take 50 mcg by mouth daily before breakfast.   lisinopril-hydrochlorothiazide 10-12.5 MG tablet Commonly known as: ZESTORETIC Take 1 tablet by mouth daily.   metFORMIN 750 MG 24 hr tablet Commonly known as: GLUCOPHAGE-XR Take 750 mg by mouth daily.   ondansetron 4 MG disintegrating tablet Commonly known as: ZOFRAN-ODT Take 1 tablet (4 mg total) by mouth every 8 (eight) hours as needed for nausea or vomiting.   pantoprazole 40 MG tablet Commonly known as: PROTONIX Take 40 mg by mouth as needed.   potassium chloride 10 MEQ tablet Commonly known as: KLOR-CON potassium chloride ER 10 mEq tablet,extended release  TK 1 T PO D   rosuvastatin 10 MG tablet Commonly known as: CRESTOR Take 10 mg by mouth daily.   spironolactone 50 MG tablet Commonly known as: ALDACTONE Take 50 mg by mouth daily.        Follow-up Information     Dillingham, Alena Bills, DO Follow up in 10 day(s).   Specialty: Plastic Surgery Contact information: [address] [place] [address] [phone]                 [facility] [address] [place], [place] [address] [phone]  Signed: Laurena Spies 12/18/2021, 7:58 AM

## 2021-12-18 NOTE — ED Provider Notes (Signed)
[facility] EMERGENCY DEPARTMENT Provider Note   CSN: [id number] Arrival date & time: 12/18/21  1332     History  Chief Complaint  Patient presents with   Loss of Consciousness    Regina Daniels is a 66 y.o. female.  66 year old female presents today for evaluation of syncopal episode.  Patient was sitting on commode when she had her syncopal episode and did not fall.  Sister was beside her at that time.  Patient had panniculectomy yesterday.  She states since leaving the hospital she did have multiple episodes of lightheadedness with positional change.  She felt lightheaded prior to her syncopal episode.  Denies chest pain, lightheadedness, shortness of breath, or other complaints.  Denies abdominal pain.  Has bilateral JP drains present.  Endorses decreased p.o. intake since yesterday.  The history is provided by the patient. No language interpreter was used.      Home Medications Prior to Admission medications   Medication Sig Start Date End Date Taking? Authorizing Provider  acyclovir ointment (ZOVIRAX) 5 % Apply topically See admin instructions. 11/02/19   [provider]  cetirizine (ZYRTEC) 10 MG tablet Take 10 mg by mouth daily.      [provider]  clindamycin (CLEOCIN T) 1 % external solution Apply topically daily. 10/03/20   [provider]  estradiol (ESTRACE) 1 MG tablet TK 1 T PO QD 08/29/18   [provider]  fluticasone (FLONASE) 50 MCG/ACT nasal spray Place into the nose. 07/01/17   [provider]  ipratropium (ATROVENT) 0.06 % nasal spray Apply 1 to 2 sprays in each nostril twice a day as needed for runny nose. 12/10/20   Dara Hoyer, FNP  ketoconazole (NIZORAL) 2 % cream Apply 1 application. topically daily.    [provider]  levothyroxine (SYNTHROID, LEVOTHROID) 50 MCG tablet Take 50 mcg by mouth daily before breakfast.    [provider]  lisinopril-hydrochlorothiazide (ZESTORETIC)  10-12.5 MG tablet Take 1 tablet by mouth daily. 11/26/21   [provider]  metFORMIN (GLUCOPHAGE-XR) 750 MG 24 hr tablet Take 750 mg by mouth daily. 11/29/20   [provider]  ondansetron (ZOFRAN-ODT) 4 MG disintegrating tablet Take 1 tablet (4 mg total) by mouth every 8 (eight) hours as needed for nausea or vomiting. 11/30/21   Corena Herter, PA-C  pantoprazole (PROTONIX) 40 MG tablet Take 40 mg by mouth as needed.     [provider]  potassium chloride (KLOR-CON) 10 MEQ tablet potassium chloride ER 10 mEq tablet,extended release  TK 1 T PO D    [provider]  rosuvastatin (CRESTOR) 10 MG tablet Take 10 mg by mouth daily. 10/24/19   [provider]  spironolactone (ALDACTONE) 50 MG tablet Take 50 mg by mouth daily. 08/04/21   [provider]      Allergies    Meperidine hcl, Other, Prednisone, Sudafed [pseudoephedrine hcl], Demerol, and Epinephrine    Review of Systems   Review of Systems  Constitutional:  Negative for chills and fever.  Respiratory:  Negative for shortness of breath.   Cardiovascular:  Negative for chest pain and palpitations.  Gastrointestinal:  Negative for abdominal pain, nausea and vomiting.  Neurological:  Positive for syncope and light-headedness.  All other systems reviewed and are negative.  Physical Exam Updated Vital Signs BP (!) 106/56   Pulse (!) 58   Temp 98.9 F (37.2 C) (Oral)   Resp 15   Ht 5' 5.5" (1.664 m)  Wt 86.5 kg   SpO2 100%   BMI 31.25 kg/m  Physical Exam Vitals and nursing note reviewed.  Constitutional:      General: She is not in acute distress.    Appearance: Normal appearance. She is not ill-appearing.  HENT:     Head: Normocephalic and atraumatic.     Nose: Nose normal.  Eyes:     General: No scleral icterus.    Extraocular Movements: Extraocular movements intact.     Conjunctiva/sclera: Conjunctivae normal.  Cardiovascular:     Rate and Rhythm: Normal rate and  regular rhythm.     Pulses: Normal pulses.     Heart sounds: Normal heart sounds.  Pulmonary:     Effort: Pulmonary effort is normal. No respiratory distress.     Breath sounds: Normal breath sounds. No wheezing or rales.  Abdominal:     General: There is no distension.     Tenderness: There is no abdominal tenderness.  Musculoskeletal:        General: Normal range of motion.     Cervical back: Normal range of motion.     Right lower leg: No edema.     Left lower leg: No edema.  Skin:    General: Skin is warm and dry.  Neurological:     General: No focal deficit present.     Mental Status: She is alert. Mental status is at baseline.    ED Results / Procedures / Treatments   Labs (all labs ordered are listed, but only abnormal results are displayed) Labs Reviewed  BASIC METABOLIC PANEL - Abnormal; Notable for the following components:      Result Value   Glucose, Bld 109 (*)    Creatinine, Ser 1.13 (*)    GFR, Estimated 54 (*)    All other components within normal limits  CBC - Abnormal; Notable for the following components:   RBC 3.41 (*)    Hemoglobin 10.2 (*)    HCT 32.0 (*)    All other components within normal limits  CBG MONITORING, ED - Abnormal; Notable for the following components:   Glucose-Capillary 105 (*)    All other components within normal limits  URINALYSIS, ROUTINE W REFLEX MICROSCOPIC    EKG EKG Interpretation  Date/Time:  Friday December 18 2021 13:52:29 EDT Ventricular Rate:  63 PR Interval:  149 QRS Duration: 84 QT Interval:  400 QTC Calculation: 410 R Axis:   14 Text Interpretation: Sinus rhythm Nonspecific T wave abnormality Confirmed by Lajean Saver [phone]) on 12/18/2021 3:39:54 PM  Radiology No results found.  Procedures Procedures    Medications Ordered in ED Medications  sodium chloride 0.9 % bolus 1,000 mL (1,000 mLs Intravenous New Bag/Given 12/18/21 1505)    ED Course/ Medical Decision Making/ A&P                            Medical Decision Making Amount and/or Complexity of Data Reviewed Labs: ordered.   66 year old female presents today for evaluation of syncopal episode.  Had lightheadedness prior to her syncopal episode.  No other high risk symptoms, or prodrome prior to the episode.  Did not fall and hit her head.  She did take lisinopril today.  She states she has had lightheadedness episodes following taking lisinopril in the past.  She has had multiple episodes today of lightheadedness since returning from the surgery center.  Endorses decreased p.o. intake.  Orthostatic positive during my interview.  Suspect patient is volume down.  Will evaluate with basic blood work, and provide IV fluids.  CBC without leukocytosis.  Hemoglobin of 10.2 however no previous to compare to.  BMP with mild renal insufficiency otherwise unremarkable.  EKG without acute ischemic changes.  Patient within my shift is awaiting volume resuscitation and reevaluation.  I suspect if her symptoms improved she is appropriate for discharge.  Final Clinical Impression(s) / ED Diagnoses Final diagnoses:  None    Rx / DC Orders ED Discharge Orders     None         Evlyn Courier, PA-C 12/18/21 1612    Charlesetta Shanks, MD 12/18/21 1729

## 2021-12-18 NOTE — H&P (Incomplete)
History and Physical    Patient: Regina Daniels OZH:[id number] DOB: [birth year] DOA: 12/18/2021 DOS: the patient was seen and examined on 12/18/2021 PCP: Burton Apley, MD  Patient coming from: {Point_of_Origin:26777}  Chief Complaint:  Chief Complaint  Patient presents with  . Loss of Consciousness   HPI: ANTOINE FIALLOS is a 66 y.o. female with medical history significant of ***  Review of Systems: {ROS_Text:26778} Past Medical History:  Diagnosis Date  . Allergy   . Angio-edema   . Arthritis    back  . Chronic headaches    Sinus  . Dysrhythmia    palpitations  . GERD (gastroesophageal reflux disease)   . Groin abscess   . Hidradenitis suppurativa    reason she is on metformin and spironolactone  . Hypertension    since age 89  . Hypothyroidism   . PONV (postoperative nausea and vomiting)   . Recurrent upper respiratory infection (URI)   . Sleep difficulties   . Thyroid disease    hypothyroidism   Past Surgical History:  Procedure Laterality Date  . ABDOMINAL HYSTERECTOMY  1993  . BREAST SURGERY  1993 and 1994   Lt lumpectomy  . INCISE AND DRAIN ABCESS     abscess left groin  . INGUINAL HIDRADENITIS EXCISION  2012   bil  . PANNICULECTOMY N/A 12/17/2021   Procedure: PANNICULECTOMY;  Surgeon: Peggye Form, DO;  Location: [facility];  Service: Plastics;  Laterality: N/A;  4 hours  . SHOULDER ACROMIOPLASTY Left 04/09/2015   Procedure: SHOULDER ACROMIOPLASTY;  Surgeon: Frederico Hamman, MD;  Location: [facility];  Service: Orthopedics;  Laterality: Left;  . SHOULDER ARTHROSCOPY WITH OPEN ROTATOR CUFF REPAIR Left 04/09/2015   Procedure: LEFT SHOULDER ARTHROSCOPY DEBRIDEMENT ,  with open acromioplasty and rotator cuff repair;  Surgeon: Frederico Hamman, MD;  Location: [facility];  Service: Orthopedics;  Laterality: Left;  . SINOSCOPY    . TEMPOROMANDIBULAR JOINT SURGERY  2002  . TUBAL LIGATION     Social History:  reports  that she has never smoked. She has never used smokeless tobacco. She reports current alcohol use. She reports that she does not use drugs.  Allergies  Allergen Reactions  . Meperidine Hcl Hives  . Other Other (See Comments)    Steroid injections-headaches  . Prednisone Other (See Comments)    Headaches   . Sudafed [Pseudoephedrine Hcl]     Pt stated, "Makes me feel wired and keeps me up all night"  . Demerol Hives, Rash and Other (See Comments)    All-over body hives  . Epinephrine Palpitations    Family History  Problem Relation Age of Onset  . Heart disease Mother   . Diabetes Mother   . Stroke Mother   . Thyroid disease Mother   . Cancer Father   . Heart disease Father   . Diabetes Father   . Stroke Father   . Thyroid disease Sister   . Asthma Sister   . Asthma Brother   . Asthma Brother   . Asthma Brother   . Asthma Sister   . Asthma Paternal Uncle     Prior to Admission medications   Medication Sig Start Date End Date Taking? Authorizing Provider  acetaminophen (TYLENOL) 500 MG tablet Take 500-1,000 mg by mouth every 6 (six) hours as needed for mild pain or headache.   Yes [provider]  acyclovir ointment (ZOVIRAX) 5 % Apply 1 application. topically daily as needed (AS DIRECTED- TO  AFFECTED SITE). 11/02/19  Yes [provider]  cetirizine (ZYRTEC) 10 MG tablet Take 10 mg by mouth at bedtime.   Yes [provider]  clindamycin (CLEOCIN T) 1 % external solution Apply 1 application. topically daily as needed (AS DIRECTED- TO AFFECTED SITE). 10/03/20  Yes [provider]  fluticasone (FLONASE) 50 MCG/ACT nasal spray Place 1 spray into both nostrils daily. 07/01/17  Yes [provider]  HYDROcodone-acetaminophen (NORCO/VICODIN) 5-325 MG tablet Take 1 tablet by mouth every 6 (six) hours as needed for moderate pain.   Yes [provider]  ipratropium (ATROVENT) 0.06 % nasal spray Apply 1 to 2 sprays in each nostril twice  a day as needed for runny nose. Patient taking differently: Place 1-2 sprays into both nostrils 2 (two) times daily as needed for rhinitis. 12/10/20  Yes Ambs, Norvel Richards, FNP  ketoconazole (NIZORAL) 2 % cream Apply 1 application. topically daily as needed for irritation (AS DIRECTED- TO AFFECTED SITE).   Yes [provider]  levothyroxine (SYNTHROID, LEVOTHROID) 50 MCG tablet Take 50 mcg by mouth daily before breakfast.   Yes [provider]  lisinopril-hydrochlorothiazide (ZESTORETIC) 10-12.5 MG tablet Take 1 tablet by mouth daily. 11/26/21  Yes [provider]  ondansetron (ZOFRAN-ODT) 4 MG disintegrating tablet Take 1 tablet (4 mg total) by mouth every 8 (eight) hours as needed for nausea or vomiting. Patient taking differently: Take 4 mg by mouth every 8 (eight) hours as needed for nausea or vomiting (dissolve orally). 11/30/21  Yes Lorelee New, PA-C  pantoprazole (PROTONIX) 40 MG tablet Take 40 mg by mouth daily before breakfast.   Yes [provider]  potassium chloride (KLOR-CON) 10 MEQ tablet Take 10 mEq by mouth daily.   Yes [provider]  rosuvastatin (CRESTOR) 10 MG tablet Take 10 mg by mouth at bedtime. 10/24/19  Yes [provider]  spironolactone (ALDACTONE) 100 MG tablet Take 100 mg by mouth at bedtime.   Yes [provider]  estradiol (ESTRACE) 1 MG tablet TK 1 T PO QD Patient not taking: Reported on 12/18/2021 08/29/18   [provider]    Physical Exam: Vitals:   12/18/21 1915 12/18/21 1930 12/18/21 2000 12/18/21 2225  BP: (!) 109/47 (!) 90/46 (!) 109/47 (!) 105/57  Pulse: 63 64 78 (!) 57  Resp: 15 18 (!) 28 16  Temp:    98.7 F (37.1 C)  TempSrc:    Oral  SpO2: 100% 97% 100% 100%  Weight:    88.3 kg  Height:       *** Data Reviewed: {Tip this will not be part of the note when signed- Document your independent interpretation of telemetry tracing, EKG, lab, Radiology test or any other diagnostic tests.  Add any new diagnostic test ordered today. (Optional):26781} {Results:26384}  Assessment and Plan: No notes have been filed under this hospital service. Service: Hospitalist     Advance Care Planning:   Code Status: Full Code ***  Consults: ***  Family Communication: ***  Severity of Illness: {Observation/Inpatient:21159}  Author: Anselm Jungling, DO 12/18/2021 11:55 PM  For on call review [email or web address].

## 2021-12-18 NOTE — ED Provider Notes (Signed)
  Physical Exam  BP (!) 109/47   Pulse 78   Temp 98.9 F (37.2 C) (Oral)   Resp (!) 28   Ht 5' 5.5" (1.664 m)   Wt 86.5 kg   SpO2 100%   BMI 31.25 kg/m   Physical Exam  Procedures  Procedures  ED Course / MDM    Medical Decision Making Amount and/or Complexity of Data Reviewed Labs: ordered. Radiology: ordered.  Risk OTC drugs. Prescription drug management. Decision regarding hospitalization.   66 year old female presented to the ED after a syncopal episode.  Patient did have a panniculectomy yesterday and was discharged today after overnight observation.  After she went home, she did take her blood pressure medication (lisinopril-HCTZ) around 11:30 AM.  She was then using the restroom when she stood up became very lightheaded and had a syncopal episode.  She was able to lower herself to the ground and did not hit her head.  On arrival, she was found to be orthostatic and has received 1 L of IV fluids.  She does note decreased p.o. intake since her surgery.  She also notes that she has had multiple episodes of lightheadedness since her PCP changed her blood pressure medication about 2 weeks ago.  On my evaluation, patient states that she feels fairly normal while lying still.  Her abdomen is soft and appropriately tender to palpation given her recent surgery.  Her blood pressures remain soft with systolics in the high 90s to low 100s.  Given her decreased p.o. intake, will give another 1 L of LR and reassess.  On reassessment after 2L of IV fluid, patient remains symptomatic and with baseline blood pressures with systolic in the 90s to 100s.  Given her persistent orthostatic hypotension, CT abdomen/pelvis with contrast was obtained to assess her recent surgery.  This did show to areas of fluid collection at the JP drain sites, likely hematoma in the setting of her recent surgery.  I have a low suspicion for infection given patient is not having any fevers and the surgery was a  recent.  Chest x-ray was unremarkable.  Her initial CBC showed a hemoglobin of 10.2 and repeat hemoglobin is 8.9.  I think her orthostatic hypotension is most likely related to ongoing blood loss anemia in the setting of her recent surgery.  Her JP drains do continue to drain, but not at an alarming rate.  She does not appear to have active extravasation on her CT scan.  Dr. Antony Madura him was notified that the patient was in the emergency department.  I contacted hospital medicine for admission and the patient was admitted to their service in stable condition.       Laurence Compton, MD 12/18/21 2038    Cathren Laine, MD 12/19/21 1501

## 2021-12-19 DIAGNOSIS — E039 Hypothyroidism, unspecified: Secondary | ICD-10-CM

## 2021-12-19 DIAGNOSIS — D649 Anemia, unspecified: Secondary | ICD-10-CM

## 2021-12-19 DIAGNOSIS — Z8739 Personal history of other diseases of the musculoskeletal system and connective tissue: Secondary | ICD-10-CM

## 2021-12-19 DIAGNOSIS — N179 Acute kidney failure, unspecified: Secondary | ICD-10-CM | POA: Diagnosis not present

## 2021-12-19 DIAGNOSIS — L732 Hidradenitis suppurativa: Secondary | ICD-10-CM | POA: Diagnosis not present

## 2021-12-19 DIAGNOSIS — R55 Syncope and collapse: Secondary | ICD-10-CM | POA: Diagnosis not present

## 2021-12-19 LAB — BASIC METABOLIC PANEL
Anion gap: 3 — ABNORMAL LOW (ref 5–15)
BUN: 13 mg/dL (ref 8–23)
CO2: 27 mmol/L (ref 22–32)
Calcium: 8.6 mg/dL — ABNORMAL LOW (ref 8.9–10.3)
Chloride: 110 mmol/L (ref 98–111)
Creatinine, Ser: 1.17 mg/dL — ABNORMAL HIGH (ref 0.44–1.00)
GFR, Estimated: 52 mL/min — ABNORMAL LOW (ref >60)
Glucose, Bld: 135 mg/dL — ABNORMAL HIGH (ref 70–99)
Potassium: 3.8 mmol/L (ref 3.5–5.1)
Sodium: 140 mmol/L (ref 135–145)

## 2021-12-19 LAB — CBC
HCT: 27 % — ABNORMAL LOW (ref 36.0–46.0)
Hemoglobin: 8.7 g/dL — ABNORMAL LOW (ref 12.0–15.0)
MCH: 30.3 pg (ref 26.0–34.0)
MCHC: 32.2 g/dL (ref 30.0–36.0)
MCV: 94.1 fL (ref 80.0–100.0)
Platelets: 224 10*3/uL (ref 150–400)
RBC: 2.87 MIL/uL — ABNORMAL LOW (ref 3.87–5.11)
RDW: 14.2 % (ref 11.5–15.5)
WBC: 7.9 10*3/uL (ref 4.0–10.5)
nRBC: 0 % (ref 0.0–0.2)

## 2021-12-19 LAB — FOLATE: Folate: 9.6 ng/mL (ref >5.9)

## 2021-12-19 LAB — VITAMIN B12: Vitamin B-12: 181 pg/mL (ref 180–914)

## 2021-12-19 MED ORDER — LEVOTHYROXINE SODIUM 50 MCG PO TABS
50.0000 ug | ORAL_TABLET | Freq: Every day | ORAL | Status: DC
  Administered 2021-12-19 – 2021-12-20 (×2): 50 ug via ORAL
  Filled 2021-12-19 (×2): qty 1

## 2021-12-19 MED ORDER — SENNA 8.6 MG PO TABS
1.0000 | ORAL_TABLET | Freq: Every day | ORAL | Status: DC | PRN
  Administered 2021-12-19: 8.6 mg via ORAL
  Filled 2021-12-19: qty 1

## 2021-12-19 MED ORDER — PANTOPRAZOLE SODIUM 40 MG PO TBEC
40.0000 mg | DELAYED_RELEASE_TABLET | Freq: Every day | ORAL | Status: DC
  Administered 2021-12-19 – 2021-12-20 (×2): 40 mg via ORAL
  Filled 2021-12-19 (×2): qty 1

## 2021-12-19 MED ORDER — LORATADINE 10 MG PO TABS
10.0000 mg | ORAL_TABLET | Freq: Every day | ORAL | Status: DC
  Administered 2021-12-19 – 2021-12-20 (×2): 10 mg via ORAL
  Filled 2021-12-19 (×2): qty 1

## 2021-12-19 MED ORDER — ROSUVASTATIN CALCIUM 5 MG PO TABS
10.0000 mg | ORAL_TABLET | Freq: Every day | ORAL | Status: DC
  Administered 2021-12-19 (×2): 10 mg via ORAL
  Filled 2021-12-19 (×2): qty 2

## 2021-12-19 NOTE — Assessment & Plan Note (Signed)
Continue levothyroxine 

## 2021-12-19 NOTE — Progress Notes (Signed)
Patient ID: Regina Daniels, female   DOB: 04/27/1956, 66 y.o.   MRN: [id number]     Subjective: Patient is a 66 year old female with past medical history including panniculitis.  Patient underwent panniculectomy with Dr. Ulice Boldillingham on 12/17/2021.  [facility] and pain control.  The next morning, 12/18/21, the patient was doing well and was discharged to home with family support.  Later in the day, patient went to the emergency room for syncopal episode.  In the ED, patient's blood pressure was monitored and she was given several liters of fluids.  Her hemoglobin in the emergency room was 10.2.  CT of the abdomen was obtained and found to have some small fluid collections which were consistent with postoperative changes.  Per ED chart, drains were putting out fluid but not an alarming rate.  Patient was then admitted to continue to monitor her blood pressure.  Upon entering the room today, patient is sitting upright in no acute distress and her chair.  Patient reports she is feeling better today. Patient reports after being discharged yesterday, she had episodes of feeling lightheaded.  She reported that she sat down and felt better.  She states she took her blood pressure medications after she came home from the surgery center. Patient states that later that day, she went to the bathroom and passed out while on the commode.  She reports she did not eat or drink much after leaving the surgery center.   Today, patient states that she has been eating and drinking without issue.  She states that she ate all of her breakfast this morning.  She reports she has been drinking fluids.  She denies any nausea or vomiting.  She reports she has been voiding.  She states she has not had a bowel movement yet but she has been passing gas.  Overall she states her postsurgical pain is controlled with Tylenol and hydrocodone.  Patient states she feels the drain output has  been slowing down.  She states they have been changing them every 4 hours or so.  Spoke with patient's nurse who states he drained approximately 40cc from the right drain and 15cc from the left drain this morning.   Objective: Vital signs in last 24 hours: Temp:  [98.1 F (36.7 C)-98.9 F (37.2 C)] 98.5 F (36.9 C) (06/03 0757) Pulse Rate:  [57-86] 67 (06/03 0900) Resp:  [14-28] 16 (06/03 0757) BP: (90-126)/(45-57) 104/45 (06/03 0757) SpO2:  [94 %-100 %] 99 % (06/03 0900) Weight:  [86.5 kg-88.3 kg] 88.3 kg (06/02 2225)    Intake/Output from previous day: 06/02 0701 - 06/03 0700 In: 1921.6 [P.O.:240; I.V.:681.6; IV Piggyback:1000] Out: 170 [Drains:170] Intake/Output this shift: No intake/output data recorded.  General appearance: alert, cooperative, and no distress Resp: Unlabored breathing, no respiratory distress GI: Incisions are intact with Steri-Strips.  Honeycomb dressings in place and are clean dry and intact.  Abdomen is soft with very minimal tenderness to palpation.  No erythema noted.  Drains are secured in place bilaterally and are functioning.  There is approximately 10 cc of serosanguineous and bloody drainage in the right drain and approximately 5 cc of bloody and serosanguineous drainage in the left drain.  Umbilicus incision is intact and the umbilicus is viable.   Lab Results:     Latest Ref Rng & Units 12/19/2021    2:12 AM 12/18/2021   10:47 PM 12/18/2021    8:18 PM  CBC  WBC 4.0 - 10.5 K/uL 7.9   8.0   8.1    Hemoglobin 12.0 - 15.0 g/dL 8.7   9.4   8.9    Hematocrit 36.0 - 46.0 % 27.0   29.2   28.4    Platelets 150 - 400 K/uL 224   234   226      BMET Recent Labs    12/18/21 1359 12/19/21 0212  NA 139 140  K 4.1 3.8  CL 108 110  CO2 24 27  GLUCOSE 109* 135*  BUN 14 13  CREATININE 1.13* 1.17*  CALCIUM 8.9 8.6*   PT/INR No results for input(s): LABPROT, INR in the last 72 hours. ABG No results for input(s): PHART, HCO3 in the last 72  hours.  Invalid input(s): PCO2, PO2  Studies/Results: CT ABDOMEN PELVIS W CONTRAST  Result Date: 12/18/2021 CLINICAL DATA:  Abdominal pain, post-op hypotensive, recent abd surgery EXAM: CT ABDOMEN AND PELVIS WITH CONTRAST TECHNIQUE: Multidetector CT imaging of the abdomen and pelvis was performed using the standard protocol following bolus administration of intravenous contrast. RADIATION DOSE REDUCTION: This exam was performed according to the departmental dose-optimization program which includes automated exposure control, adjustment of the mA and/or kV according to patient size and/or use of iterative reconstruction technique. CONTRAST:  OMNIPAQUE IOHEXOL 300 MG/ML  SOLN COMPARISON:  CT abdomen pelvis 02/03/2012 FINDINGS: Lower chest: Bibasilar linear atelectasis versus scarring. No acute abnormality. Hepatobiliary: No focal liver abnormality. No gallstones, gallbladder wall thickening, or pericholecystic fluid. No biliary dilatation. Pancreas: No focal lesion. Normal pancreatic contour. No surrounding inflammatory changes. No main pancreatic ductal dilatation. Spleen: Normal in size without focal abnormality. Adrenals/Urinary Tract: No adrenal nodule bilaterally. Bilateral kidneys enhance symmetrically. Subcentimeter hypodensities are too small to characterize. No hydronephrosis. No hydroureter. The urinary bladder is unremarkable. Stomach/Bowel: Stomach is within normal limits. No evidence of bowel wall thickening or dilatation. Scattered colonic diverticulosis. Appendix appears normal. 80 Vascular/Lymphatic: No abdominal aorta or iliac aneurysm. Mild atherosclerotic plaque of the aorta and its branches. No abdominal, pelvic, or inguinal lymphadenopathy. Reproductive: Status post hysterectomy. No adnexal masses. Other: No intraperitoneal free fluid. No intraperitoneal free gas. No organized fluid collection. Musculoskeletal: Diffuse subcutaneus soft tissue edema and emphysema likely postsurgical in  etiology with associated couple of surgical drains. No peripherally enhancing organized fluid collection. Couple of deep sub cutaneous densities along the surgical drain measuring 5.5 x 1.7 cm on the right and 5.5 x 1.6 cm on the left. No suspicious lytic or blastic osseous lesions. No acute displaced fracture. Multilevel degenerative changes of the spine. IMPRESSION: 1. Diffuse subcutaneus soft tissue edema and emphysema likely postsurgical in etiology with associated couple of surgical drains. Couple of deep subcutaneous densities along the surgical drain measuring 5.5 x 1.7 cm on the right and 5.5 x 1.6 cm on the left likely representing hematomas or seromas. No peripherally enhancing organized fluid collection to suggest abscess formation. Correlate clinically for superimposed infection given emphysema. 2. No acute intra-abdominal or intrapelvic abnormality. Electronically Signed   By: Tish Frederickson M.D.   On: 12/18/2021 20:02   DG Chest Portable 1 View  Result Date: 12/18/2021 CLINICAL DATA:  Seizure yesterday. EXAM: PORTABLE CHEST 1 VIEW COMPARISON:  03/31/2011 FINDINGS: Numerous leads and wires project over the chest. Midline trachea. Normal heart size. No pleural effusion or pneumothorax. Suspect interval left perihilar scarring. This area is suboptimally evaluated secondary to overlying wire and lead artifacts. Clear right lung. IMPRESSION: No acute cardiopulmonary disease. Suspect interval left perihilar  scarring. Recommend PA and lateral radiographs after removal of all EKG wires and leads. Electronically Signed   By: Jeronimo Greaves M.D.   On: 12/18/2021 19:42    Anti-infectives: Anti-infectives (From admission, onward)    None       Assessment/Plan: s/p panniculectomy  Discussed with patient the importance of fluid intake and nutrition postoperatively.  Patient acknowledged.  Discussed with patient that if she has any questions or concerns that she may call us at any time.  -Continue  to monitor drain output. Instructed patient's nurse to contact our team if drains are putting out copious amount of drainage.  -Continue abdominal binder at all times.  -[facility] -Medical management per hospitalist team.  Dr. Ulice Bold has been aware of patient's admission.  Discussed objective findings, imaging, labs and plan with Dr. Ulice Bold.   LOS: 0 days    Laurena Spies, PA-C 12/19/2021

## 2021-12-19 NOTE — Assessment & Plan Note (Addendum)
Normocytic anemia. Hgb of 10.2.  Possible from recent surgery but no prior for comparison. Will check vitamin b12 and folic.

## 2021-12-19 NOTE — Assessment & Plan Note (Signed)
Creatinine mildly elevated to 1.13 although this is improved from 1.27 several weeks ago.  Monitor with repeat lab in the morning after IV fluids overnight.

## 2021-12-19 NOTE — Progress Notes (Signed)
PROGRESS NOTE    Regina Daniels  PXT:[id number] DOB: [birth year] DOA: 12/18/2021 PCP: Burton Apley, MD   Brief Narrative:  Regina Daniels is a 66 y.o. female with medical history significant of History of panniculitis s/p panniculectomy, hidradenitis suppurativa, hypothyroidism, hyperlipidemia who presents with concerns of syncope.   She is postop day 1 from panniculectomy by plastic surgery Dr. Ulice Bold.  After returning home she has been feeling dizzy with ambulation.  Today she again felt dizzy when standing at her sink and then had her sister assist her to a chair where she had brief loss of consciousness.  Assessment & Plan:   Principal Problem:   Syncope Active Problems:   Hidradenitis   History of panniculitis   Hypothyroidism   AKI (acute kidney injury) (HCC)   Anemia   Syncope Likely secondary to hypotension Rule out orthostatic hypotension Suspect secondary to hypotension related to her new antihypertensives and recent decreased oral intake surrounding her abdominal surgery. -Currently on lisinopril-HCTZ and increased spironolactone just 3 days prior to admission -Hold all home antihypertensives.   -Increase PO intake - follow clinically - restart BP meds once MAP improves   Anemia, likely acute blood loss anemia given recent procedure, POA Partially hemodilutional Unknown baseline (last labs 10 years ago 14) 10.2 at intake - 8.7 today in the setting of IVF Continue to follow - no signs/symptoms of bleeding at this point   AKI (acute kidney injury) (HCC) Resolving after IVF - increase PO intake   Hypothyroidism Continue levothyroxine   History of panniculitis -S/p panniculectomy on 6/1 with plastic surgeon Dr. Ulice Bold -Asymptomatic from an abdominal standpoint.  Continue surgical drain care daily.  Follow output. -Pain currently well controlled   Hidradenitis Hold on spironolactone due to soft blood pressure  DVT prophylaxis: early ambulation Code  Status: Full Family Communication: At bedside  Status is: Inpt  Dispo: The patient is from: Home              Anticipated d/c is to: Home              Anticipated d/c date is: 24-48h              Patient currently NOT medically stable for discharge  Consultants:  None  Procedures:  None  Antimicrobials:  None   Subjective: No acute issues/events overnight, lightheadedness improving  Objective: Vitals:   12/18/21 1930 12/18/21 2000 12/18/21 2225 12/19/21 0403  BP: (!) 90/46 (!) 109/47 (!) 105/57 (!) 120/55  Pulse: 64 78 (!) 57 (!) 58  Resp: 18 (!) 28 16 16   Temp:   98.7 F (37.1 C) 98.1 F (36.7 C)  TempSrc:   Oral Oral  SpO2: 97% 100% 100% 100%  Weight:   88.3 kg   Height:        Intake/Output Summary (Last 24 hours) at 12/19/2021 0745 Last data filed at 12/19/2021 0403 Gross per 24 hour  Intake 1921.62 ml  Output 170 ml  Net 1751.62 ml   Filed Weights   12/18/21 1342 12/18/21 2225  Weight: 86.5 kg 88.3 kg    Examination:  General exam: Appears calm and comfortable  Respiratory system: Clear to auscultation. Respiratory effort normal. Cardiovascular system: S1 & S2 heard, RRR. No JVD, murmurs, rubs, gallops or clicks. No pedal edema. Gastrointestinal system: Abdomen is nondistended, soft and nontender. No organomegaly or masses felt. Normal bowel sounds heard. Central nervous system: Alert and oriented. No focal neurological deficits. Extremities: Symmetric 5 x 5 power. Skin:  No rashes, lesions or ulcers Psychiatry: Judgement and insight appear normal. Mood & affect appropriate.     Data Reviewed: I have personally reviewed following labs and imaging studies  CBC: Recent Labs  Lab 12/18/21 1359 12/18/21 2018 12/18/21 2247 12/19/21 0212  WBC 9.0 8.1 8.0 7.9  HGB 10.2* 8.9* 9.4* 8.7*  HCT 32.0* 28.4* 29.2* 27.0*  MCV 93.8 95.3 93.9 94.1  PLT 265 226 234 224   Basic Metabolic Panel: Recent Labs  Lab 12/18/21 1359 12/19/21 0212  NA 139 140  K  4.1 3.8  CL 108 110  CO2 24 27  GLUCOSE 109* 135*  BUN 14 13  CREATININE 1.13* 1.17*  CALCIUM 8.9 8.6*   GFR: Estimated Creatinine Clearance: 53.1 mL/min (A) (by C-G formula based on SCr of 1.17 mg/dL (H)). Liver Function Tests: Recent Labs  Lab 12/18/21 2018  AST 18  ALT 11  ALKPHOS 51  BILITOT 0.4  PROT 5.7*  ALBUMIN 3.0*   No results for input(s): LIPASE, AMYLASE in the last 168 hours. No results for input(s): AMMONIA in the last 168 hours. Coagulation Profile: No results for input(s): INR, PROTIME in the last 168 hours. Cardiac Enzymes: No results for input(s): CKTOTAL, CKMB, CKMBINDEX, TROPONINI in the last 168 hours. BNP (last 3 results) No results for input(s): PROBNP in the last 8760 hours. HbA1C: No results for input(s): HGBA1C in the last 72 hours. CBG: Recent Labs  Lab 12/18/21 1408  GLUCAP 105*   Lipid Profile: No results for input(s): CHOL, HDL, LDLCALC, TRIG, CHOLHDL, LDLDIRECT in the last 72 hours. Thyroid Function Tests: No results for input(s): TSH, T4TOTAL, FREET4, T3FREE, THYROIDAB in the last 72 hours. Anemia Panel: Recent Labs    12/19/21 0212  VITAMINB12 181  FOLATE 9.6   Sepsis Labs: Recent Labs  Lab 12/18/21 2018 12/18/21 2247  LATICACIDVEN 1.3 1.2    No results found for this or any previous visit (from the past 240 hour(s)).       Radiology Studies: CT ABDOMEN PELVIS W CONTRAST  Result Date: 12/18/2021 CLINICAL DATA:  Abdominal pain, post-op hypotensive, recent abd surgery EXAM: CT ABDOMEN AND PELVIS WITH CONTRAST TECHNIQUE: Multidetector CT imaging of the abdomen and pelvis was performed using the standard protocol following bolus administration of intravenous contrast. RADIATION DOSE REDUCTION: This exam was performed according to the departmental dose-optimization program which includes automated exposure control, adjustment of the mA and/or kV according to patient size and/or use of iterative reconstruction technique.  CONTRAST:  OMNIPAQUE IOHEXOL 300 MG/ML  SOLN COMPARISON:  CT abdomen pelvis 02/03/2012 FINDINGS: Lower chest: Bibasilar linear atelectasis versus scarring. No acute abnormality. Hepatobiliary: No focal liver abnormality. No gallstones, gallbladder wall thickening, or pericholecystic fluid. No biliary dilatation. Pancreas: No focal lesion. Normal pancreatic contour. No surrounding inflammatory changes. No main pancreatic ductal dilatation. Spleen: Normal in size without focal abnormality. Adrenals/Urinary Tract: No adrenal nodule bilaterally. Bilateral kidneys enhance symmetrically. Subcentimeter hypodensities are too small to characterize. No hydronephrosis. No hydroureter. The urinary bladder is unremarkable. Stomach/Bowel: Stomach is within normal limits. No evidence of bowel wall thickening or dilatation. Scattered colonic diverticulosis. Appendix appears normal. 80 Vascular/Lymphatic: No abdominal aorta or iliac aneurysm. Mild atherosclerotic plaque of the aorta and its branches. No abdominal, pelvic, or inguinal lymphadenopathy. Reproductive: Status post hysterectomy. No adnexal masses. Other: No intraperitoneal free fluid. No intraperitoneal free gas. No organized fluid collection. Musculoskeletal: Diffuse subcutaneus soft tissue edema and emphysema likely postsurgical in etiology with associated couple of surgical drains. No peripherally  enhancing organized fluid collection. Couple of deep sub cutaneous densities along the surgical drain measuring 5.5 x 1.7 cm on the right and 5.5 x 1.6 cm on the left. No suspicious lytic or blastic osseous lesions. No acute displaced fracture. Multilevel degenerative changes of the spine. IMPRESSION: 1. Diffuse subcutaneus soft tissue edema and emphysema likely postsurgical in etiology with associated couple of surgical drains. Couple of deep subcutaneous densities along the surgical drain measuring 5.5 x 1.7 cm on the right and 5.5 x 1.6 cm on the left likely  representing hematomas or seromas. No peripherally enhancing organized fluid collection to suggest abscess formation. Correlate clinically for superimposed infection given emphysema. 2. No acute intra-abdominal or intrapelvic abnormality. Electronically Signed   By: Tish FredericksonMorgane  Naveau M.D.   On: 12/18/2021 20:02   DG Chest Portable 1 View  Result Date: 12/18/2021 CLINICAL DATA:  Seizure yesterday. EXAM: PORTABLE CHEST 1 VIEW COMPARISON:  03/31/2011 FINDINGS: Numerous leads and wires project over the chest. Midline trachea. Normal heart size. No pleural effusion or pneumothorax. Suspect interval left perihilar scarring. This area is suboptimally evaluated secondary to overlying wire and lead artifacts. Clear right lung. IMPRESSION: No acute cardiopulmonary disease. Suspect interval left perihilar scarring. Recommend PA and lateral radiographs after removal of all EKG wires and leads. Electronically Signed   By: Jeronimo GreavesKyle  Talbot M.D.   On: 12/18/2021 19:42    Scheduled Meds:  levothyroxine  50 mcg Oral QAC breakfast   loratadine  10 mg Oral Daily   pantoprazole  40 mg Oral QAC breakfast   rosuvastatin  10 mg Oral QHS   Continuous Infusions:  lactated ringers 75 mL/hr at 12/19/21 0403    LOS: 0 days   Time spent: 45min  Azucena FallenWilliam C Kainon Varady, DO [facility]  If 7PM-7AM, please contact night-coverage [email or web address]  12/19/2021, 7:45 AM

## 2021-12-19 NOTE — Assessment & Plan Note (Addendum)
S/p panniculectomy on 6/1 with plastic surgeon Dr. Ulice Bold -Asymptomatic from an abdominal standpoint.  Continue surgical drain care daily.  We will measure drain output daily.

## 2021-12-19 NOTE — Assessment & Plan Note (Signed)
Hold on spironolactone due to soft blood pressure

## 2021-12-19 NOTE — Assessment & Plan Note (Addendum)
Suspect secondary to hypotension related to her antihypertensives and recent decreased oral intake surrounding her abdominal surgery. -Has been taking lisinopril-HCTZ as needed and recently took a dose along with an increase in her spironolactone from 50 mg to 100 mg 3 days ago. -Hold all home antihypertensives.   -We will continue to hydrate with IV fluids overnight and will clinicically assess tomorrow

## 2021-12-20 DIAGNOSIS — N179 Acute kidney failure, unspecified: Secondary | ICD-10-CM | POA: Diagnosis not present

## 2021-12-20 DIAGNOSIS — D649 Anemia, unspecified: Secondary | ICD-10-CM | POA: Diagnosis not present

## 2021-12-20 DIAGNOSIS — R55 Syncope and collapse: Secondary | ICD-10-CM | POA: Diagnosis not present

## 2021-12-20 DIAGNOSIS — L732 Hidradenitis suppurativa: Secondary | ICD-10-CM | POA: Diagnosis not present

## 2021-12-20 LAB — CBC
HCT: 26 % — ABNORMAL LOW (ref 36.0–46.0)
Hemoglobin: 8.3 g/dL — ABNORMAL LOW (ref 12.0–15.0)
MCH: 30 pg (ref 26.0–34.0)
MCHC: 31.9 g/dL (ref 30.0–36.0)
MCV: 93.9 fL (ref 80.0–100.0)
Platelets: 219 10*3/uL (ref 150–400)
RBC: 2.77 MIL/uL — ABNORMAL LOW (ref 3.87–5.11)
RDW: 14.2 % (ref 11.5–15.5)
WBC: 9.4 10*3/uL (ref 4.0–10.5)
nRBC: 0 % (ref 0.0–0.2)

## 2021-12-20 LAB — BASIC METABOLIC PANEL
Anion gap: 6 (ref 5–15)
BUN: 13 mg/dL (ref 8–23)
CO2: 26 mmol/L (ref 22–32)
Calcium: 8.8 mg/dL — ABNORMAL LOW (ref 8.9–10.3)
Chloride: 106 mmol/L (ref 98–111)
Creatinine, Ser: 1.14 mg/dL — ABNORMAL HIGH (ref 0.44–1.00)
GFR, Estimated: 53 mL/min — ABNORMAL LOW (ref >60)
Glucose, Bld: 94 mg/dL (ref 70–99)
Potassium: 4.5 mmol/L (ref 3.5–5.1)
Sodium: 138 mmol/L (ref 135–145)

## 2021-12-20 MED ORDER — SPIRONOLACTONE 100 MG PO TABS: 50.0000 mg | ORAL_TABLET | Freq: Every day | ORAL | 0 refills | Status: DC

## 2021-12-20 NOTE — Progress Notes (Signed)
I reviewed d/c instructions with patient. No further questions. Pt d/c via wheelchair to private vehicle in stable condition

## 2021-12-20 NOTE — Care Management Obs Status (Signed)
MEDICARE OBSERVATION STATUS NOTIFICATION   Patient Details  Name: Regina Daniels MRN: [id number] Date of Birth: [birth year]   Medicare Observation Status Notification Given:  Yes    Bess Kinds, RN 12/20/2021, 7:48 AM

## 2021-12-20 NOTE — Progress Notes (Signed)
  Transition of Care [facility]) Screening Note   Patient Details  Name: Regina Daniels Date of Birth: 1956/04/28   Transition of Care [facility]) CM/SW Contact:    Bartholomew Crews, RN Phone Number: [phone] 12/20/2021, 7:58 AM    Transition of Care Department [facility]) has reviewed patient and no TOC needs have been identified at this time. We will continue to monitor patient advancement through interdisciplinary progression rounds. If new patient transition needs arise, please place a TOC consult.

## 2021-12-20 NOTE — Discharge Summary (Signed)
Physician Discharge Summary  Regina Daniels WJX:[id number] DOB: 09-27-1955 DOA: 12/18/2021  PCP: Burton Apley, MD  Admit date: 12/18/2021 Discharge date: 12/20/2021  Admitted From: Home Disposition: Home  Recommendations for Outpatient Follow-up:  Follow up with PCP in 1-2 weeks Follow-up with plastic surgery as scheduled  Home Health: None Equipment/Devices: None  Discharge Condition: Stable CODE STATUS: Full Diet recommendation: Low-salt low-fat diet as discussed  Brief/Interim Summary: Regina Daniels is a 66 y.o. female with medical history significant of History of panniculitis s/p panniculectomy, hidradenitis suppurativa, hypothyroidism, hyperlipidemia who presents with concerns of syncope.   She is postop day 1 from panniculectomy by plastic surgery Dr. Ulice Bold.  After returning home she has been feeling dizzy with ambulation.  Today she again felt dizzy when standing at her sink and then had her sister assist her to a chair where she had brief loss of consciousness.  Patient had notable hypotension at intake, worse with orthostatics in the setting of recent medication changes.  Patient's blood pressure continued to stabilize over the past 24 hours, symptoms essentially resolving.  At this time we will discontinue patient's home lisinopril HCTZ and continue half dose spironolactone only at this time.  Close follow-up with PCP this week, already has an appointment in 4 days time.  Follow-up with plastic surgery for postoperative care as discussed.  Discharge Diagnoses:  Principal Problem:   Syncope Active Problems:   Hidradenitis   History of panniculitis   Hypothyroidism   AKI (acute kidney injury) (HCC)   Anemia  Syncope Likely secondary to hypotension Orthostatic hypotension   Anemia, likely acute blood loss anemia given recent procedure, POA Partially hemodilutional   AKI (acute kidney injury) (HCC)   Hypothyroidism   History of panniculitis    Hidradenitis   Discharge Instructions  Discharge Instructions     Discharge patient   Complete by: As directed    Discharge disposition: 01-Home or Self Care   Discharge patient date: 12/20/2021      Allergies as of 12/20/2021       Reactions   Meperidine Hcl Hives   Other Other (See Comments)   Steroid injections-headaches   Prednisone Other (See Comments)   Headaches   Sudafed [pseudoephedrine Hcl]    Pt stated, "Makes me feel wired and keeps me up all night"   Demerol Hives, Rash, Other (See Comments)   All-over body hives   Epinephrine Palpitations        Medication List     STOP taking these medications    lisinopril-hydrochlorothiazide 10-12.5 MG tablet Commonly known as: ZESTORETIC       TAKE these medications    acetaminophen 500 MG tablet Commonly known as: TYLENOL Take 500-1,000 mg by mouth every 6 (six) hours as needed for mild pain or headache.   acyclovir ointment 5 % Commonly known as: ZOVIRAX Apply 1 application. topically daily as needed (AS DIRECTED- TO AFFECTED SITE).   cetirizine 10 MG tablet Commonly known as: ZYRTEC Take 10 mg by mouth at bedtime.   clindamycin 1 % external solution Commonly known as: CLEOCIN T Apply 1 application. topically daily as needed (AS DIRECTED- TO AFFECTED SITE).   estradiol 1 MG tablet Commonly known as: ESTRACE TK 1 T PO QD   fluticasone 50 MCG/ACT nasal spray Commonly known as: FLONASE Place 1 spray into both nostrils daily.   HYDROcodone-acetaminophen 5-325 MG tablet Commonly known as: NORCO/VICODIN Take 1 tablet by mouth every 6 (six) hours as needed for moderate pain.  ipratropium 0.06 % nasal spray Commonly known as: ATROVENT Apply 1 to 2 sprays in each nostril twice a day as needed for runny nose. What changed:  how much to take how to take this when to take this reasons to take this additional instructions   ketoconazole 2 % cream Commonly known as: NIZORAL Apply 1 application.  topically daily as needed for irritation (AS DIRECTED- TO AFFECTED SITE).   levothyroxine 50 MCG tablet Commonly known as: SYNTHROID Take 50 mcg by mouth daily before breakfast.   ondansetron 4 MG disintegrating tablet Commonly known as: ZOFRAN-ODT Take 1 tablet (4 mg total) by mouth every 8 (eight) hours as needed for nausea or vomiting. What changed: reasons to take this   pantoprazole 40 MG tablet Commonly known as: PROTONIX Take 40 mg by mouth daily before breakfast.   potassium chloride 10 MEQ tablet Commonly known as: KLOR-CON Take 10 mEq by mouth daily.   rosuvastatin 10 MG tablet Commonly known as: CRESTOR Take 10 mg by mouth at bedtime.   spironolactone 100 MG tablet Commonly known as: ALDACTONE Take 0.5 tablets (50 mg total) by mouth at bedtime. What changed: how much to take        Allergies  Allergen Reactions   Meperidine Hcl Hives   Other Other (See Comments)    Steroid injections-headaches   Prednisone Other (See Comments)    Headaches    Sudafed [Pseudoephedrine Hcl]     Pt stated, "Makes me feel wired and keeps me up all night"   Demerol Hives, Rash and Other (See Comments)    All-over body hives   Epinephrine Palpitations    Consultations: None   Procedures/Studies: CT ABDOMEN PELVIS W CONTRAST  Result Date: 12/18/2021 CLINICAL DATA:  Abdominal pain, post-op hypotensive, recent abd surgery EXAM: CT ABDOMEN AND PELVIS WITH CONTRAST TECHNIQUE: Multidetector CT imaging of the abdomen and pelvis was performed using the standard protocol following bolus administration of intravenous contrast. RADIATION DOSE REDUCTION: This exam was performed according to the departmental dose-optimization program which includes automated exposure control, adjustment of the mA and/or kV according to patient size and/or use of iterative reconstruction technique. CONTRAST:  OMNIPAQUE IOHEXOL 300 MG/ML  SOLN COMPARISON:  CT abdomen pelvis 02/03/2012 FINDINGS: Lower  chest: Bibasilar linear atelectasis versus scarring. No acute abnormality. Hepatobiliary: No focal liver abnormality. No gallstones, gallbladder wall thickening, or pericholecystic fluid. No biliary dilatation. Pancreas: No focal lesion. Normal pancreatic contour. No surrounding inflammatory changes. No main pancreatic ductal dilatation. Spleen: Normal in size without focal abnormality. Adrenals/Urinary Tract: No adrenal nodule bilaterally. Bilateral kidneys enhance symmetrically. Subcentimeter hypodensities are too small to characterize. No hydronephrosis. No hydroureter. The urinary bladder is unremarkable. Stomach/Bowel: Stomach is within normal limits. No evidence of bowel wall thickening or dilatation. Scattered colonic diverticulosis. Appendix appears normal. 80 Vascular/Lymphatic: No abdominal aorta or iliac aneurysm. Mild atherosclerotic plaque of the aorta and its branches. No abdominal, pelvic, or inguinal lymphadenopathy. Reproductive: Status post hysterectomy. No adnexal masses. Other: No intraperitoneal free fluid. No intraperitoneal free gas. No organized fluid collection. Musculoskeletal: Diffuse subcutaneus soft tissue edema and emphysema likely postsurgical in etiology with associated couple of surgical drains. No peripherally enhancing organized fluid collection. Couple of deep sub cutaneous densities along the surgical drain measuring 5.5 x 1.7 cm on the right and 5.5 x 1.6 cm on the left. No suspicious lytic or blastic osseous lesions. No acute displaced fracture. Multilevel degenerative changes of the spine. IMPRESSION: 1. Diffuse subcutaneus soft tissue edema and emphysema  likely postsurgical in etiology with associated couple of surgical drains. Couple of deep subcutaneous densities along the surgical drain measuring 5.5 x 1.7 cm on the right and 5.5 x 1.6 cm on the left likely representing hematomas or seromas. No peripherally enhancing organized fluid collection to suggest abscess formation.  Correlate clinically for superimposed infection given emphysema. 2. No acute intra-abdominal or intrapelvic abnormality. Electronically Signed   By: Tish Frederickson M.D.   On: 12/18/2021 20:02   DG Chest Portable 1 View  Result Date: 12/18/2021 CLINICAL DATA:  Seizure yesterday. EXAM: PORTABLE CHEST 1 VIEW COMPARISON:  03/31/2011 FINDINGS: Numerous leads and wires project over the chest. Midline trachea. Normal heart size. No pleural effusion or pneumothorax. Suspect interval left perihilar scarring. This area is suboptimally evaluated secondary to overlying wire and lead artifacts. Clear right lung. IMPRESSION: No acute cardiopulmonary disease. Suspect interval left perihilar scarring. Recommend PA and lateral radiographs after removal of all EKG wires and leads. Electronically Signed   By: Jeronimo Greaves M.D.   On: 12/18/2021 19:42     Subjective: No acute issues or events overnight denies nausea vomiting diarrhea constipation headache fevers chills or chest pain   Discharge Exam: Vitals:   12/19/21 1932 12/20/21 0608  BP: (!) 122/54   Pulse: 72   Resp: 18 18  Temp:  98.8 F (37.1 C)  SpO2: 100% 98%   Vitals:   12/19/21 1500 12/19/21 1600 12/19/21 1932 12/20/21 0608  BP:  (!) 104/59 (!) 122/54   Pulse: 75 62 72   Resp:  18 18 18   Temp:  98 F (36.7 C)  98.8 F (37.1 C)  TempSrc:  Oral Oral Oral  SpO2: 97% 98% 100% 98%  Weight:      Height:        General: Pt is alert, awake, not in acute distress Cardiovascular: RRR, S1/S2 +, no rubs, no gallops Respiratory: CTA bilaterally, no wheezing, no rhonchi Abdominal: Soft, NT, ND, bowel sounds + Extremities: no edema, no cyanosis    The results of significant diagnostics from this hospitalization (including imaging, microbiology, ancillary and laboratory) are listed below for reference.     Microbiology: No results found for this or any previous visit (from the past 240 hour(s)).   Labs: BNP (last 3 results) No results for  input(s): BNP in the last 8760 hours. Basic Metabolic Panel: Recent Labs  Lab 12/18/21 1359 12/19/21 0212 12/20/21 0316  NA 139 140 138  K 4.1 3.8 4.5  CL 108 110 106  CO2 24 27 26   GLUCOSE 109* 135* 94  BUN 14 13 13   CREATININE 1.13* 1.17* 1.14*  CALCIUM 8.9 8.6* 8.8*   Liver Function Tests: Recent Labs  Lab 12/18/21 2018  AST 18  ALT 11  ALKPHOS 51  BILITOT 0.4  PROT 5.7*  ALBUMIN 3.0*   No results for input(s): LIPASE, AMYLASE in the last 168 hours. No results for input(s): AMMONIA in the last 168 hours. CBC: Recent Labs  Lab 12/18/21 1359 12/18/21 2018 12/18/21 2247 12/19/21 0212 12/20/21 0316  WBC 9.0 8.1 8.0 7.9 9.4  HGB 10.2* 8.9* 9.4* 8.7* 8.3*  HCT 32.0* 28.4* 29.2* 27.0* 26.0*  MCV 93.8 95.3 93.9 94.1 93.9  PLT 265 226 234 224 219   Cardiac Enzymes: No results for input(s): CKTOTAL, CKMB, CKMBINDEX, TROPONINI in the last 168 hours. BNP: Invalid input(s): POCBNP CBG: Recent Labs  Lab 12/18/21 1408  GLUCAP 105*   D-Dimer No results for input(s): DDIMER in the last  72 hours. Hgb A1c No results for input(s): HGBA1C in the last 72 hours. Lipid Profile No results for input(s): CHOL, HDL, LDLCALC, TRIG, CHOLHDL, LDLDIRECT in the last 72 hours. Thyroid function studies No results for input(s): TSH, T4TOTAL, T3FREE, THYROIDAB in the last 72 hours.  Invalid input(s): FREET3 Anemia work up Recent Labs    12/19/21 0212  VITAMINB12 181  FOLATE 9.6   Urinalysis    Component Value Date/Time   COLORURINE STRAW (A) 12/18/2021 1844   APPEARANCEUR CLEAR 12/18/2021 1844   LABSPEC 1.004 (L) 12/18/2021 1844   PHURINE 5.0 12/18/2021 1844   GLUCOSEU NEGATIVE 12/18/2021 1844   HGBUR NEGATIVE 12/18/2021 1844   BILIRUBINUR NEGATIVE 12/18/2021 1844   KETONESUR NEGATIVE 12/18/2021 1844   PROTEINUR NEGATIVE 12/18/2021 1844   NITRITE NEGATIVE 12/18/2021 1844   LEUKOCYTESUR NEGATIVE 12/18/2021 1844   Sepsis Labs Invalid input(s): PROCALCITONIN,  WBC,   LACTICIDVEN Microbiology No results found for this or any previous visit (from the past 240 hour(s)).   Time coordinating discharge: Over 30 minutes  SIGNED:   Azucena FallenWilliam C Baley Lorimer, DO [facility] 12/20/2021, 11:21 AM Pager   If 7PM-7AM, please contact night-coverage [email or web address]

## 2021-12-20 NOTE — Progress Notes (Signed)
Ambulated patient and checked oxygen, sats stayed above 98% while ambulating without oxygen.

## 2021-12-29 ENCOUNTER — Ambulatory Visit (INDEPENDENT_AMBULATORY_CARE_PROVIDER_SITE_OTHER): Payer: Medicare Other | Admitting: Plastic Surgery

## 2021-12-29 DIAGNOSIS — M793 Panniculitis, unspecified: Secondary | ICD-10-CM

## 2021-12-29 NOTE — Progress Notes (Signed)
   Subjective:    Patient ID: Regina Daniels, female    DOB: Oct 13, 1955, 66 y.o.   MRN: [id number]  The patient is a 66 year old female here for follow-up after undergoing a panniculectomy.  She is doing much better but still has episodes of low blood pressure.  Her drain output is still too high to remove the drain.  I think we could probably get the right drain out in 1 week.  She can remove the dressing if it gets wet on the inside.  Continue with the binder or spanks.      Review of Systems  Constitutional:  Positive for activity change.  Eyes: Negative.   Respiratory: Negative.    Cardiovascular: Negative.   Gastrointestinal:  Positive for abdominal pain.  Endocrine: Negative.   Genitourinary: Negative.        Objective:   Physical Exam Constitutional:      Appearance: Normal appearance.  Cardiovascular:     Rate and Rhythm: Normal rate.  Abdominal:     Palpations: Abdomen is soft.  Skin:    General: Skin is warm.     Capillary Refill: Capillary refill takes less than 2 seconds.  Neurological:     Mental Status: She is alert and oriented to person, place, and time.  Psychiatric:        Mood and Affect: Mood normal.        Behavior: Behavior normal.        Thought Content: Thought content normal.        Assessment & Plan:     ICD-10-CM   1. Panniculitis  M79.3        Plan to remove right drain next week if output is low enough.  Continue with spanks or binder.

## 2022-01-04 ENCOUNTER — Telehealth (INDEPENDENT_AMBULATORY_CARE_PROVIDER_SITE_OTHER): Payer: Medicare Other | Admitting: Physician Assistant

## 2022-01-04 ENCOUNTER — Ambulatory Visit (INDEPENDENT_AMBULATORY_CARE_PROVIDER_SITE_OTHER): Payer: Medicare Other | Admitting: Student

## 2022-01-04 VITALS — BP 126/78 | HR 68 | Temp 98.7°F

## 2022-01-04 DIAGNOSIS — Z9889 Other specified postprocedural states: Secondary | ICD-10-CM

## 2022-01-04 MED ORDER — DOXYCYCLINE HYCLATE 100 MG PO TABS: 100.0000 mg | ORAL_TABLET | Freq: Two times a day (BID) | ORAL | 0 refills | Status: AC

## 2022-01-04 NOTE — Telephone Encounter (Signed)
Patient is a 65 year old female with PMH of panniculitis s/p panniculectomy performed 12/20/2021 by Dr. Ulice Bold who called into the on-call service on 01/02/2022 with complaints of mild bleeding/drainage with concern for possible wound.  She states that she called her sister to evaluate her wound after she noticed bloody drainage from underneath her dressing.  She states that she is still putting out approximately 70 cc from one of her drains and 20 cc from the other drain.  Patient told me that her bleeding was controlled with compression, denied any bright red blood.  Describes it as a darker, old blood.  She denied any pain or lightheadedness.  She reports that her sister was unable to visualize the wound or assess exactly how large it was.  Discussed conservative management versus ER.  She did not feel as though she needed to go to the ER at that time and instead will simply apply compressive wrap with ABD pads/gauze as needed for drainage.  She understands that if it were to worsen, she would need to go to the hospital and call back.  Otherwise, plan for outpatient follow-up on Monday for in-person examination.

## 2022-01-04 NOTE — Progress Notes (Unsigned)
Patient is a 66 year old female with history of panniculitis.  She underwent panniculectomy with Dr. Ulice Bold on 12/17/2021.  She presents to the clinic for follow-up.  Patient was last seen in the clinic yesterday 01/04/2022.  At this visit, she had complaints regarding increased drainage from her incision.  On exam, there were 2 wounds noted to the medial aspect of her inferior abdominal incision.  There is no cellulitic changes to the skin or purulence coming from the wounds.  There was serosanguineous drainage expressed from the wound.  Drains were in place.  They were not removed at that visit. Calcium alginate was placed over the wounds and then covered with ABD pads. Doxycycline ordered. Plan for wound vac.   Today, patient reports she is doing okay.  She denies any major changes since yesterday.  She reports that she is still draining decent amount from her incision.  She reports she has had to change the ABD pads a few times.  She denies any fevers.   Chaperone present on exam.  On exam, patient is sitting upright in no acute distress.  Abdomen is soft.  There is no ecchymosis or erythema noted.  Umbilicus appears viable.  Incision surrounding umbilicus is intact.  There are two wounds to the medial aspect of the inferior abdominal incision.  They appear similar to yesterday. Abdominal incision is otherwise intact.  There is no surrounding erythema or cellulitic skin changes.  There is no purulent drainage. Serosanguineous drainage noted upon expression of the wounds. Drains are in place bilaterally.  There is approximately 2 to 5 cc of serosanguineous drainage in each bulb.  Drain sites appear mildly irritated.   Discussed with patient to continue the calcium alginate, ABD pads and abdominal binder.  Discussed with patient that she may change the calcium alginate every other day.  Told her to change the ABD pads as needed.  Discussed she may put small amount of vaseline around the drain sites.  Patient reports that she has people who can help her with the dressings.   Discussed with patient that we are working on ordering a wound VAC for her.  Discussed with patient that she should call us and return to our office once the wound VAC is delivered to her home.  She will then bring the back to the office and we will place the wound VAC on her own.  Patient acknowledged.  Discussed with patient that we will also work on setting up home health care for wound management.  Patient to call back if she has any questions or concerns.  Patient to follow-up when she has the wound VAC.  Dr. Ulice Bold also examined the patient. Plan discussed with Dr. Ulice Bold.

## 2022-01-04 NOTE — Progress Notes (Signed)
Patient is a 66 year old female with history of panniculitis. Patient underwent panniculectomy with Dr. Ulice Bold on 12/17/21. She is 18 days postop. Patient presents today for follow up. Patient called the on-call service over the weekend with concern for possible wound and mild bleeding/drainage.   Patient was last seen in the clinic on 12/29/21. At this visit, patient was doing better although she was still having episodes of low blood pressure. Drains were left in place.   Today patient reports she is feeling better than she did last week.  She states that she is not having episodes of lightheadedness anymore.  She reports she has been following with her PCP regarding this.  Patient states that on Saturday she noticed drainage on her honeycomb dressings.  She states this drainage leaked through the honeycomb dressing and onto her leg.  She reports her drains were putting out approximately 70 to 90 cc/day on the left and approximately 20 to 30 cc/day on the right.  She denies any fevers. She reports she has been eating and drinking without issue. She denies nausea or vomiting. She reports she has been voiding without issue. She states she has been having bowel movements.   BP 126/78 (BP Location: Left Arm, Patient Position: Sitting, Cuff Size: Large)   Pulse 68   Temp 98.7 F (37.1 C)   SpO2 100%     Chaperone present on exam.  Vital signs are stable.  On exam, patient is sitting upright in the chair in no acute acute distress.  Her breathing is unlabored and she is in no respiratory distress.  Her abdomen is soft.  There is no ecchymosis noted.  Incisions to the umbilicus are intact.  Umbilicus is viable.   Honeycomb dressing noted to have serous drainage and minimal amount of dried blood to it. Honeycomb removed. Steri strips saturated with serous fluid. Steri strips were removed. Incision is intact to the lateral aspects of the incision bilaterally.  There are two wounds to the medial aspect of the  incision. The wounds are both draining serosanguineous drainage upon expression. No purulent fluid or active bleeding is noted. There are no cellulitic changes. The wound located to the right of midline is 8x1 cm. The wound to the left of midline is 5x1 cm. There is some slough noted around the wounds. This was debrided and patient tolerated well.   Drains are in place bilaterally. There is some mild irritation surrounding the drains. There is approximately 10cc of serosanguineous drainage in each of the bulbs. There is a blister noted to the skin to the right lateral aspect near the drain site. It does not appear infected.   Placed calcium alginate over the wounds to the medial aspect of the incision and placed ABDs over the entire incision. Discussed with patient she may replace ABDs with new ABDs or maxi pads if they get saturated with drainage for now. Discussed with patient she may gently massage the area but to use avoid massaging directly on the incision. Discussed with patient that she will most likely need a wound vac for her wounds. Discussed with patient to continue abdominal binder for compression. Discussed with patient she may put vaseline over the umbilicus, over the blister near her right drain and at the drain insertion sites.  Patient acknowledged.   Will prescribe doxycycline for patient. Patient reports she has taken this in the past and tolerated.   Will order wound vac.   Patient to follow up tomorrow.  Instructed patient to call us if she has any concerns, questions or new changes. Patient acknowledged.   Pictures were obtained and placed in the patient's chart with the patient's permission.   Objective findings and plan were discussed with Dr. Ulice Bold.

## 2022-01-05 ENCOUNTER — Ambulatory Visit (INDEPENDENT_AMBULATORY_CARE_PROVIDER_SITE_OTHER): Payer: Medicare Other | Admitting: Student

## 2022-01-05 DIAGNOSIS — Z9889 Other specified postprocedural states: Secondary | ICD-10-CM

## 2022-01-07 ENCOUNTER — Ambulatory Visit: Payer: Medicare Other | Admitting: Family Medicine

## 2022-01-07 VITALS — BP 118/78 | HR 68 | Temp 98.4°F | Ht 65.0 in | Wt 194.0 lb

## 2022-01-07 DIAGNOSIS — I9589 Other hypotension: Secondary | ICD-10-CM | POA: Diagnosis not present

## 2022-01-07 DIAGNOSIS — D649 Anemia, unspecified: Secondary | ICD-10-CM | POA: Diagnosis not present

## 2022-01-07 NOTE — Progress Notes (Signed)
Subjective:    Patient ID: Regina Daniels, female    DOB: [birth year], 66 y.o.   MRN: [id number]  HPI Patient was recently admitted for a panniculectomy.  Patient was discharged home with 2 surgical drains.  Shortly after returning home she developed hypotension and lightheadedness.  She was taken back to the emergency room where she was found to be anemic with a hemoglobin around 8.  She was also found to be hypotensive.  CT scan of the abdomen and pelvis showed 2 hematoma/seromas as well as subcutaneous emphysema and 2 drains.  However there were no signs of any abscess.  Hemoglobin remained stable above 8 during the hospitalization.  Antihypertensives were held and she was discharged home on midodrine.  She has since stopped the midodrine.  She is maintaining a systolic blood pressure between 101 120 off her antihypertensive medication.  She is not taking any iron.  She states that she feels weak and tired but she denies any chest pain shortness of breath or syncope.  She does have dehiscence of her abdominal wound but this is being followed by plastics surgery and they are arranging a wound VAC for the patient Past Medical History:  Diagnosis Date   Allergy    Angio-edema    Arthritis    back   Chronic headaches    Sinus   Dysrhythmia    palpitations   GERD (gastroesophageal reflux disease)    Groin abscess    Hidradenitis suppurativa    reason she is on metformin and spironolactone   Hypertension    since age 82   Hypothyroidism    PONV (postoperative nausea and vomiting)    Recurrent upper respiratory infection (URI)    Sleep difficulties    Thyroid disease    hypothyroidism   Past Surgical History:  Procedure Laterality Date   ABDOMINAL HYSTERECTOMY  1993   BREAST SURGERY  1993 and 1994   Lt lumpectomy   INCISE AND DRAIN ABCESS     abscess left groin   INGUINAL HIDRADENITIS EXCISION  2012   bil   PANNICULECTOMY N/A 12/17/2021   Procedure: PANNICULECTOMY;  Surgeon:  Peggye Form, DO;  Location: [facility];  Service: Plastics;  Laterality: N/A;  4 hours   SHOULDER ACROMIOPLASTY Left 04/09/2015   Procedure: SHOULDER ACROMIOPLASTY;  Surgeon: Frederico Hamman, MD;  Location: [facility];  Service: Orthopedics;  Laterality: Left;   SHOULDER ARTHROSCOPY WITH OPEN ROTATOR CUFF REPAIR Left 04/09/2015   Procedure: LEFT SHOULDER ARTHROSCOPY DEBRIDEMENT ,  with open acromioplasty and rotator cuff repair;  Surgeon: Frederico Hamman, MD;  Location: [facility];  Service: Orthopedics;  Laterality: Left;   SINOSCOPY     TEMPOROMANDIBULAR JOINT SURGERY  2002   TUBAL LIGATION     Current Outpatient Medications on File Prior to Visit  Medication Sig Dispense Refill   acetaminophen (TYLENOL) 500 MG tablet Take 500-1,000 mg by mouth every 6 (six) hours as needed for mild pain or headache.     acyclovir ointment (ZOVIRAX) 5 % Apply 1 application. topically daily as needed (AS DIRECTED- TO AFFECTED SITE).     cetirizine (ZYRTEC) 10 MG tablet Take 10 mg by mouth at bedtime.     clindamycin (CLEOCIN T) 1 % external solution Apply 1 application. topically daily as needed (AS DIRECTED- TO AFFECTED SITE).     doxycycline (VIBRA-TABS) 100 MG tablet Take 1 tablet (100 mg total) by mouth 2 (two) times daily for 7 days.  14 tablet 0   estradiol (ESTRACE) 1 MG tablet      fluticasone (FLONASE) 50 MCG/ACT nasal spray Place 1 spray into both nostrils daily.     HYDROcodone-acetaminophen (NORCO/VICODIN) 5-325 MG tablet Take 1 tablet by mouth every 6 (six) hours as needed for moderate pain.     ipratropium (ATROVENT) 0.06 % nasal spray Apply 1 to 2 sprays in each nostril twice a day as needed for runny nose. (Patient taking differently: Place 1-2 sprays into both nostrils 2 (two) times daily as needed for rhinitis.) 15 mL 5   ketoconazole (NIZORAL) 2 % cream Apply 1 application. topically daily as needed for irritation (AS DIRECTED- TO AFFECTED  SITE).     levothyroxine (SYNTHROID, LEVOTHROID) 50 MCG tablet Take 50 mcg by mouth daily before breakfast.     ondansetron (ZOFRAN-ODT) 4 MG disintegrating tablet Take 1 tablet (4 mg total) by mouth every 8 (eight) hours as needed for nausea or vomiting. (Patient taking differently: Take 4 mg by mouth every 8 (eight) hours as needed for nausea or vomiting (dissolve orally).) 20 tablet 0   pantoprazole (PROTONIX) 40 MG tablet Take 40 mg by mouth daily before breakfast.     potassium chloride (KLOR-CON) 10 MEQ tablet Take 10 mEq by mouth daily.     rosuvastatin (CRESTOR) 10 MG tablet Take 10 mg by mouth at bedtime.     spironolactone (ALDACTONE) 100 MG tablet Take 0.5 tablets (50 mg total) by mouth at bedtime. 15 tablet 0   No current facility-administered medications on file prior to visit.   Allergies  Allergen Reactions   Meperidine Hcl Hives   Other Other (See Comments)    Steroid injections-headaches   Prednisone Other (See Comments)    Headaches    Sudafed [Pseudoephedrine Hcl]     Pt stated, "Makes me feel wired and keeps me up all night"   Demerol Hives, Rash and Other (See Comments)    All-over body hives   Epinephrine Palpitations   Social History   Socioeconomic History   Marital status: Widowed    Spouse name: Not on file   Number of children: Not on file   Years of education: Not on file   Highest education level: Not on file  Occupational History   Not on file  Tobacco Use   Smoking status: Never   Smokeless tobacco: Never  Vaping Use   Vaping Use: Never used  Substance and Sexual Activity   Alcohol use: Yes    Comment: social   Drug use: No   Sexual activity: Not Currently    Birth control/protection: Surgical  Other Topics Concern   Not on file  Social History Narrative   Not on file   Social Determinants of Health   Financial Resource Strain: Not on file  Food Insecurity: Not on file  Transportation Needs: Not on file  Physical Activity: Not on  file  Stress: Not on file  Social Connections: Not on file  Intimate Partner Violence: Not on file   Family History  Problem Relation Age of Onset   Heart disease Mother    Diabetes Mother    Stroke Mother    Thyroid disease Mother    Cancer Father    Heart disease Father    Diabetes Father    Stroke Father    Thyroid disease Sister    Asthma Sister    Asthma Brother    Asthma Brother    Asthma Brother    Asthma Sister  Asthma Paternal Uncle      Review of Systems     Objective:   Physical Exam Constitutional:      General: She is not in acute distress.    Appearance: Normal appearance. She is not ill-appearing, toxic-appearing or diaphoretic.  HENT:     Head: Normocephalic and atraumatic.     Mouth/Throat:     Mouth: Mucous membranes are moist.     Pharynx: Oropharynx is clear. No oropharyngeal exudate or posterior oropharyngeal erythema.  Neck:     Vascular: No carotid bruit.  Cardiovascular:     Rate and Rhythm: Normal rate and regular rhythm.     Pulses: Normal pulses.     Heart sounds: Normal heart sounds. No murmur heard.    No friction rub. No gallop.  Pulmonary:     Effort: Pulmonary effort is normal. No respiratory distress.     Breath sounds: Normal breath sounds. No stridor. No wheezing, rhonchi or rales.  Abdominal:     General: Bowel sounds are normal.     Palpations: Abdomen is soft.     Tenderness: There is abdominal tenderness.  Musculoskeletal:     Cervical back: No rigidity.     Right lower leg: No edema.     Left lower leg: No edema.  Lymphadenopathy:     Cervical: No cervical adenopathy.  Skin:    Coloration: Skin is not jaundiced.     Findings: No erythema.  Neurological:     Mental Status: She is alert.  Psychiatric:        Mood and Affect: Mood normal.        Behavior: Behavior normal.        Thought Content: Thought content normal.     Surgical wound is covered.  Patient is seeing plastic surgery for this regularly so I  will defer this to their management.      Assessment & Plan:  Other specified hypotension - Plan: CBC with Differential/Platelet, BASIC METABOLIC PANEL WITH GFR  Postoperative anemia Blood pressure today is excellent.  Continue to hold antihypertensives and push Gatorade and eat a diet rich in sodium including canned foods.  If systolic blood pressure drops below 100 I would take midodrine.  Check CBC and CMP today to monitor hemoglobin.  If hemoglobin is stable or improved I would recommend simply starting ferrous sulfate 325 mg twice daily and rechecking a CBC in 1 month.  Continue to hold antihypertensives until systolic blood pressure is greater than 140

## 2022-01-07 NOTE — Progress Notes (Signed)
Per pt, per hospital to "hold" all the meds for now

## 2022-01-08 ENCOUNTER — Telehealth: Payer: Self-pay

## 2022-01-08 LAB — CBC WITH DIFFERENTIAL/PLATELET
Absolute Monocytes: 725 cells/uL (ref 200–950)
Basophils Absolute: 69 cells/uL (ref 0–200)
Basophils Relative: 0.7 %
Eosinophils Absolute: 412 cells/uL (ref 15–500)
Eosinophils Relative: 4.2 %
HCT: 29.1 % — ABNORMAL LOW (ref 35.0–45.0)
Hemoglobin: 9.3 g/dL — ABNORMAL LOW (ref 11.7–15.5)
Lymphs Abs: 3097 cells/uL (ref 850–3900)
MCH: 28.4 pg (ref 27.0–33.0)
MCHC: 32 g/dL (ref 32.0–36.0)
MCV: 89 fL (ref 80.0–100.0)
MPV: 9.4 fL (ref 7.5–12.5)
Monocytes Relative: 7.4 %
Neutro Abs: 5498 cells/uL (ref 1500–7800)
Neutrophils Relative %: 56.1 %
Platelets: 618 10*3/uL — ABNORMAL HIGH (ref 140–400)
RBC: 3.27 10*6/uL — ABNORMAL LOW (ref 3.80–5.10)
RDW: 13.1 % (ref 11.0–15.0)
Total Lymphocyte: 31.6 %
WBC: 9.8 10*3/uL (ref 3.8–10.8)

## 2022-01-08 LAB — BASIC METABOLIC PANEL WITH GFR
BUN/Creatinine Ratio: 14 (calc) (ref 6–22)
BUN: 15 mg/dL (ref 7–25)
CO2: 25 mmol/L (ref 20–32)
Calcium: 9.2 mg/dL (ref 8.6–10.4)
Chloride: 106 mmol/L (ref 98–110)
Creat: 1.08 mg/dL — ABNORMAL HIGH (ref 0.50–1.05)
Glucose, Bld: 78 mg/dL (ref 65–99)
Potassium: 4.5 mmol/L (ref 3.5–5.3)
Sodium: 142 mmol/L (ref 135–146)
eGFR: 57 mL/min/{1.73_m2} — ABNORMAL LOW (ref >=60)

## 2022-01-11 ENCOUNTER — Telehealth: Payer: Self-pay

## 2022-01-12 ENCOUNTER — Ambulatory Visit (INDEPENDENT_AMBULATORY_CARE_PROVIDER_SITE_OTHER): Payer: Medicare Other | Admitting: Student

## 2022-01-12 ENCOUNTER — Other Ambulatory Visit: Payer: Self-pay | Admitting: Student

## 2022-01-12 DIAGNOSIS — Z9889 Other specified postprocedural states: Secondary | ICD-10-CM

## 2022-01-12 NOTE — Progress Notes (Signed)
Patient is a 66 year old female with history of panniculitis.  Patient underwent panniculectomy with Dr. Ulice Bold on 12/17/2021.  Patient presents today for follow-up and wound VAC application.  Patient was last seen in the clinic on 01/05/2022.  At this visit, patient was complaining of drainage from her incision.  Patient was found to have wounds to the medial aspect of her abdominal incision.  There was no purulent drainage or surrounding erythema.  Calcium alginate, ABD pads and binder were placed over the wound sites while awaiting wound VAC to arrive in the clinic to apply to the wound.  Today, patient reports she is feeling better overall.  She states that she feels she is getting less lightheaded and feels stronger.  Patient does report though that the incision has been draining a lot, but has gone down a little bit today.  She reports she has had to change the dressings in the morning and at night daily.  She denies any fevers or chills.  She denies any redness surrounding the wound.  She denies any purulent drainage from the wound.  Patient reports there is been approximately 10 cc/day in each of the drains.  Chaperone present on exam.  On exam, patient is sitting upright in no acute distress.  Patient abdomen is soft.  The umbilicus incisions are intact.  The umbilicus appears viable.  There are 2 wounds to the medial aspect of the inferior abdominal incision.  The wound to the right of midline is approximately 5 x 1 cm.  The wound to the left of midline is approximately 8 x 1 cm.  There is no surrounding erythema, or cellulitic changes to the skin.  There is no purulent drainage.  Minimal amount of serous fluid expressed upon palpation.  Otherwise abdominal incision is intact.  Very small superficial skin tear noted to right lateral abdomen.  No surrounding erythema. The drains are in place and functioning bilaterally.  There is approximately 5 cc of serous drainage in each of the bulbs.  Drain on  the right side was removed.  Patient tolerated well.  Vaseline and gauze were placed over the drain site.  Wound VAC was placed over the wounds to the medial aspect of the inferior abdominal incision.  Wound VAC placed to 100 mmHg continuous.  A good seal was achieved.  Discussed with patient that she will keep the wound VAC in place.  Discussed with the patient that we ordered home health for her for 2 times per week wound VAC changes.  Discussed with patient to continue compressive garments.  Discussed with patient that she may place Vaseline over the umbilicus, drain sites, and skin tear daily.  Discussed with patient that we will evaluate on her next visit if we can remove the left drain.  Discussed with patient that if she has any issues or questions regarding the wound VAC, or any other concerns to please call us.  Patient to follow-up on Friday for evaluation and wound VAC change.  Pictures were obtained and placed in the patient's chart with the patient's permission.

## 2022-01-13 ENCOUNTER — Telehealth: Payer: Self-pay

## 2022-01-13 NOTE — Telephone Encounter (Signed)
Faxed HH orders to [facility], per [place]. Also fax sent to [facility]. Confirmation of receipt for both.

## 2022-01-13 NOTE — Telephone Encounter (Signed)
Faxed wound vac order to [place] with [facility] with confirmation of receipt.  Received wound vac in office. Vac was placed on the patient at her 6/27 visit and operation was explained.

## 2022-01-14 NOTE — Progress Notes (Signed)
Patient is a 66 year old female with history of panniculitis.  Patient underwent panniculectomy with Dr. Ulice Bold on 12/17/2021.  Patient was found to have developed abdominal wounds at the inferior abdominal incision site.  Patient presents to the clinic today for follow-up and wound VAC change.  Patient was last seen in the clinic on 01/12/2022.  At this visit, patient was doing well.  She reported that she felt the drainage was going down a little bit but it was still present.  Overall she reported she felt better and stronger.  On exam, there were 2 wounds present to the medial aspect of the inferior abdominal incision.  There was no surrounding erythema or purulent drainage.  A wound VAC was placed over the wounds.  Her right drain was also removed.  Today, patient reports she is doing well.  She denies any issues with the wound VAC.  She reports the suture came off at the left drain insertion site.  She reports that the drain has been putting out approximately 5 to 10 cc/day.  She denies any other issues or complaints at this time.  She denies any fevers or chills.  Chaperone present on exam.  On exam, patient is sitting upright in no acute distress.  Abdomen is soft.  There is no ecchymosis noted.  Umbilicus appears viable.  Wound VAC is in place and is functioning.  The canister has approximately 30 cc of serosanguineous drainage.  Drain on the left side is in place and functioning.  The suture has been pulled through the skin.  There is approximately 5 cc of serous fluid in the bulb.  Wound VAC was removed.  Wound to the right of the midline of the incision is approximately 7-1/2 cm x 1 cm.  Wound to the left of midline is approximately 5 cm x 1 cm.  Both wounds appear healthy.  There is no purulent drainage or surrounding erythema.  Previous right drain site appears to be healing well.  There is an area to the right aspect of the incision that appears to be irritated by a suture.  The suture was  removed.  Vaseline placed over the site.  There is a small skin tear to the skin on the right abdomen, which appears to be from tape.  It does not appear infected.  New wound VAC was placed.  Adequate seal was achieved.  Medipore tape was placed around the drain to support the drain.  Discussed with patient to continue compression.  Discussed with patient we will evaluate at her next visit if the drain can be removed.  Discussed with patient to continue the wound VAC.  Discussed with patient that if she has any issues with the wound VAC to let us know.  Discussed patient to exercise caution with her train since it is not sutured in place.  Discussed to let us know if she has any issues with the drain.  Discussed with patient that she may place Vaseline over the area of irritation on her incision and over the skin tear.  Patient acknowledged.  Patient to follow-up on Monday for possible drain removal.  Pictures were obtained of the patient and placed in the patient's chart with the patient's permission.

## 2022-01-15 ENCOUNTER — Encounter: Payer: 59 | Admitting: Physician Assistant

## 2022-01-15 ENCOUNTER — Telehealth: Payer: Self-pay | Admitting: Student

## 2022-01-15 ENCOUNTER — Telehealth: Payer: Self-pay

## 2022-01-15 ENCOUNTER — Ambulatory Visit (INDEPENDENT_AMBULATORY_CARE_PROVIDER_SITE_OTHER): Payer: Medicare Other | Admitting: Student

## 2022-01-15 DIAGNOSIS — Z9889 Other specified postprocedural states: Secondary | ICD-10-CM

## 2022-01-15 NOTE — Telephone Encounter (Signed)
I faxed [facility] a 2nd time with the request for [facility] for patient. Received confirmation receipt of fax.  Fax: [phone]

## 2022-01-15 NOTE — Telephone Encounter (Signed)
Called [facility] this morning and l/m for [facility] with intake to call me back. I am trying to check status on request for [facility]. I called: [phone].

## 2022-01-15 NOTE — Telephone Encounter (Signed)
Patient called later this afternoon with questions regarding her drain.  She states that the bulb is not going back to suction.  Patient also states concerned that her wound VAC is not functioning properly.  Patient over the phone states that the screen on the wound VAC shows the wound VAC is on continuous therapy at 100 mmHg.  She states that screen does not indicate any sort of leak or issue.  Patient states that she looked at the wound VAC site and it appears to be functioning properly.  Discussed with patient to continue to attempt to squeeze the bulb of the drain.  Discussed with patient to follow-up on Monday in regards to the drain.  Discussed with patient to call if she has any other questions or concerns.

## 2022-01-18 ENCOUNTER — Ambulatory Visit (INDEPENDENT_AMBULATORY_CARE_PROVIDER_SITE_OTHER): Payer: Medicare Other | Admitting: Physician Assistant

## 2022-01-18 DIAGNOSIS — Z9889 Other specified postprocedural states: Secondary | ICD-10-CM

## 2022-01-18 NOTE — Progress Notes (Signed)
Patient is a 66 year old female with PMH of panniculitis s/p panniculectomy performed 12/18/2021 Dr. Ulice Bold who presents to clinic for postoperative follow-up.  Unfortunately this patient developed large incisional wounds requiring wound VAC placement.  He has been difficult to obtain [facility], so she presents today for reevaluation as well as possible wound VAC change.  She was last seen here in clinic on 01/15/2022.  At that time, wound to the right of the midline of the incision is 7.5 x 1 cm.  Wound to the left of midline is approximately 5 x 1 cm.  They were healthy appearing.  New wound VAC was replaced.  Adequate seal achieved.  Mediport was placed around the drain for support.  Today, patient is doing well.  She states that her energy levels have rebounded quite nicely.  Her blood pressures are also consistently improving.  She reports that her drain tube keeps trying to fall out completely and is barely hanging on.  She is hoping they can be removed today.  She states that she has a good seal on her wound VAC, no specific complaints.  Abdomen soft, nondistended.  JP drain secured with tape.  Stitches have already ripped out.  Wound VAC in place with good seal.  No surrounding erythema.  No drainage.  Left-sided JP drain is removed without complication or difficulty.  Patient to return in 2 days for wound VAC change.

## 2022-01-20 ENCOUNTER — Ambulatory Visit (INDEPENDENT_AMBULATORY_CARE_PROVIDER_SITE_OTHER): Payer: Medicare Other | Admitting: Student

## 2022-01-20 DIAGNOSIS — Z9889 Other specified postprocedural states: Secondary | ICD-10-CM | POA: Diagnosis not present

## 2022-01-20 NOTE — Progress Notes (Signed)
Patient is a 66 year old female with history of panniculitis.  Patient underwent panniculectomy with Dr. Ulice Bold on 12/17/2021.  Patient was found to have developed abdominal wounds at the inferior abdominal incision site.  Patient presents to the clinic today for follow-up and wound VAC change.  Patient was last seen in the clinic on 01/18/2022.  At this visit, patient reported she was doing well.  She stated her energy levels were improved.  On exam, her abdomen was soft.  Her wound VAC was found to have a good seal.  Left drain was secured with tape.  The left drain was removed.  Today, patient reports she is doing well.  She states that she feels much better after the drain coming out on Monday.  Patient reports that Monday night the wound VAC alarm was going off for potential blockage.  Patient states that it improved after she placed the back in a certain position.  Patient states she did not change the canister.  Otherwise patient denies any other issues at this time.  Chaperone present on exam.  On exam, patient is sitting upright in no acute distress.  Wound VAC was removed.  Abdomen is soft.  Umbilicus appears to be healing well.  Wound to just right of midline on the inferior abdominal incision is approximately 7 cm x 1 cm and the wound just to the left of midline on the inferior abdominal incision is approximately 4 cm x 1 cm.  There is some irritation noted to the skin near the wounds.  There is no purulent drainage.  There is no malodor.  Wound appears well perfused.  Inferior abdominal incision is otherwise intact.  Left drain site is healing.  ACell powder was placed over the wounds and new wound VAC sponge was placed.  Wound VAC was turned on and adequate seal was achieved.  Discussed with patient that the wound VAC may have had issues with blockage.  Discussed with patient she can try changing the canister if this happens again.  Showed patient how to change the canister.  Discussed with  patient that if she has any questions or concerns about the wound VAC she can call us.  Home health has been set up for the patient.  The plan is for home health to come on Monday.  Discussed with the patient that she can wait until Monday to get the wound VAC change, but if she is having any issues or has any concerns with the wound VAC that she can come in on Friday to be evaluated and to have the wound VAC changed.  Patient is to otherwise follow-up next Friday.  Pictures were obtained of the patient and placed in the patient's chart with the patient's permission.  Patient also examined by Dr. Ulice Bold. Plan discussed with Dr. Ulice Bold.

## 2022-01-28 NOTE — Progress Notes (Addendum)
Patient is a 66 year old female with history of panniculitis.  Patient underwent panniculectomy with Dr. Ulice Bold on 12/17/2021.  Patient was found to have developed abdominal wounds at the inferior abdominal incision site.  Patient presents to the clinic for follow-up.  Patient was last seen in the clinic on 01/20/2022.  At this visit, she was doing well.  She did report the wound VAC Signaling to wear that the tubing was clogged, but the patient was able to fix this with positioning.  On exam, wounds were measured at approximately 7 cm x 1 cm and 4 cm x 1 cm.  There is no purulent drainage or malodor.  ACell powder was placed wound VAC sponge was applied.  Wound VAC was turned on and adequate seal was achieved.  Plan was for patient to have home health nurse come a few days after that visit.  Plan to change the wound VAC twice a week.  Today, patient reports she is doing well.  She states that home health changed her wound VAC on Monday.  She states that the wound care nurse reported the skin surrounding the wound looked irritated.  Patient denies any other issues with the wound.  She states that her wound VAC has been functioning properly.  She reports she has not had to change the canister in between wound VAC changes.   Patient also reports her blood pressure has been elevated for the past week or so.  She states that she has been following up with her primary care provider regarding this.  She states that she has been started on hydrochlorothiazide by her primary care doctor.  Chaperone present on exam.  On exam, patient is sitting upright in no acute distress.  Her abdomen is soft and nontender.  Umbilicus is viable.  Wound VAC was removed from the wound.  There is a slight odor.  The wound to the right of midline is approximately 5 x 1 cm x 0.5 cm.  The wound to the left of midline is approximately 3-1/2 x 1/2 cm x  0.5 cm they appear improved from previous exam.  There is no purulent drainage.  Wound  is well perfused.  Skin surrounding the wounds is slightly irritated, but improved from last week.  There are some very small superficial wounds to the left lateral incision.  There is some firmness underneath the right lateral aspect of the incision, which feels consistent with scarring.  Drain sites are healing well.  Wound was cleaned with Vashe.  A new wound VAC sponge was applied.  An adequate seal was achieved.  Vaseline and gauze were placed over the very small lateral wounds along the incision.  I discussed with the patient that she should continue to have home health care change the wound VAC 2 times per week.  I discussed with the patient that she may have the home health nurse cleaned the wound with Vashe at her wound VAC changes, as this may help with the odor.  Donated Vashe given to the patient.  I discussed with the patient that she may put a little bit of Vaseline and gauze over the small wounds on the lateral aspect of the incisions.  I discussed with the patient to gently massage the right lateral aspect of her incision.   Patient to follow-up in 2 weeks.  Instructed patient to call if she has any questions or concerns.  Pictures were obtained of the patient and placed in the patient's chart with the patient's permission.

## 2022-01-29 ENCOUNTER — Ambulatory Visit (INDEPENDENT_AMBULATORY_CARE_PROVIDER_SITE_OTHER): Payer: Medicare Other | Admitting: Student

## 2022-01-29 DIAGNOSIS — Z9889 Other specified postprocedural states: Secondary | ICD-10-CM

## 2022-01-29 DIAGNOSIS — T8131XA Disruption of external operation (surgical) wound, not elsewhere classified, initial encounter: Secondary | ICD-10-CM

## 2022-02-08 ENCOUNTER — Other Ambulatory Visit: Payer: Medicare Other

## 2022-02-08 DIAGNOSIS — I9589 Other hypotension: Secondary | ICD-10-CM

## 2022-02-08 DIAGNOSIS — D649 Anemia, unspecified: Secondary | ICD-10-CM

## 2022-02-09 LAB — CBC WITH DIFFERENTIAL/PLATELET
Absolute Monocytes: 540 cells/uL (ref 200–950)
Basophils Absolute: 72 cells/uL (ref 0–200)
Basophils Relative: 1.1 %
Eosinophils Absolute: 150 cells/uL (ref 15–500)
Eosinophils Relative: 2.3 %
HCT: 38.4 % (ref 35.0–45.0)
Hemoglobin: 12.2 g/dL (ref 11.7–15.5)
Lymphs Abs: 2353 cells/uL (ref 850–3900)
MCH: 28.3 pg (ref 27.0–33.0)
MCHC: 31.8 g/dL — ABNORMAL LOW (ref 32.0–36.0)
MCV: 89.1 fL (ref 80.0–100.0)
MPV: 11.1 fL (ref 7.5–12.5)
Monocytes Relative: 8.3 %
Neutro Abs: 3387 cells/uL (ref 1500–7800)
Neutrophils Relative %: 52.1 %
Platelets: 350 10*3/uL (ref 140–400)
RBC: 4.31 10*6/uL (ref 3.80–5.10)
RDW: 13.2 % (ref 11.0–15.0)
Total Lymphocyte: 36.2 %
WBC: 6.5 10*3/uL (ref 3.8–10.8)

## 2022-02-09 LAB — BASIC METABOLIC PANEL
BUN: 13 mg/dL (ref 7–25)
CO2: 26 mmol/L (ref 20–32)
Calcium: 10 mg/dL (ref 8.6–10.4)
Chloride: 103 mmol/L (ref 98–110)
Creat: 0.96 mg/dL (ref 0.50–1.05)
Glucose, Bld: 89 mg/dL (ref 65–99)
Potassium: 4.3 mmol/L (ref 3.5–5.3)
Sodium: 141 mmol/L (ref 135–146)

## 2022-02-11 ENCOUNTER — Telehealth: Payer: Self-pay | Admitting: *Deleted

## 2022-02-11 NOTE — Telephone Encounter (Signed)
Spoke with Bonita Quin from [facility] to update on pt's most recent wound measurements from 7/14 visit. Advised pt's next appointment is tomorrow

## 2022-02-12 ENCOUNTER — Ambulatory Visit: Payer: Medicare Other | Admitting: Student

## 2022-02-12 DIAGNOSIS — T8131XA Disruption of external operation (surgical) wound, not elsewhere classified, initial encounter: Secondary | ICD-10-CM

## 2022-02-12 DIAGNOSIS — Z9889 Other specified postprocedural states: Secondary | ICD-10-CM

## 2022-02-12 NOTE — Progress Notes (Signed)
Patient is a 66 year old female with history of panniculitis.  Patient underwent panniculectomy with Dr. Ulice Bold on 12/17/2021.  Patient was found to have developed abdominal wounds at the inferior abdominal incision site.  Patient presents to the clinic for follow-up.  Patient was last seen in the clinic on 01/29/2022.  At this visit, she was doing well.  She reported that home health was set up and had already come to her house earlier in the week to change her wound VAC.  On exam, the wound just to the left of midline is approximately 3.5 x 0.5 x 0.5 cm in the wound to the right of midline is approximately 5 x 1 x 0.5 cm.  Plan is for patient to follow-up in 2 weeks.  Today, patient reports she is doing well.  She states that her left side of the abdomen is mildly tender.  She feels this occurred after she slept in bed and rolled over too quickly.  She denies any other issues at this time.  She states that home health care has been changing her wound VAC 2 times per week.  She denies any fevers or chills.  Chaperone present on exam.  On exam, patient is sitting upright in no acute distress.  Abdomen is soft.  There is very minimal tenderness to palpation to the left aspect of the incision.  There is no overlying erythema, swelling or drainage.  There are 2 pinpoint wounds noted to the left lateral aspect of the incision.  There is some minimal serosanguineous drainage coming from these.  The wound VAC was removed from the medial abdominal wounds.  The wound just left to midline is approximately 3 cm x 1 cm x 0.25 cm.  It appears to be healing really well.  There is no purulent drainage or surrounding erythema.  The abdominal wound just to the right of midline is approximately 4.5 cm x 1 cm x 0.5 cm.  This wound also appears to be healing well.  There is some minor irritation noted to the superior aspect of the skin near the wound.  There is no purulent drainage noted.  Incision is otherwise intact.  Wound  VAC sponge and drapes were replaced to the wound just to the right of midline.  Adequate seal was achieved.  I discussed with the patient that the wound just left of midline no longer requires the wound VAC.  I discussed with the patient that she can put a small strip of Medihoney then place gauze and Medipore tape over this wound.  I told her she can change the Medihoney every other day.  A Medihoney sample was given to the patient.  I discussed with the patient that she can put a small amount of Vaseline on the pinpoint wounds to the left lateral aspect of her incision.  I discussed with the patient that she should continue to sleep with the head of the bed elevated.  Continue wound VAC change to abdominal wound just right of midline 2 times per week.  Plan to see patient next week.  Instructed patient to call if she has any questions or concerns.  Pictures were obtained to the patient and placed in the patient's chart with the patient's permission.

## 2022-02-18 ENCOUNTER — Ambulatory Visit (INDEPENDENT_AMBULATORY_CARE_PROVIDER_SITE_OTHER): Payer: Medicare Other | Admitting: Student

## 2022-02-18 DIAGNOSIS — Z9889 Other specified postprocedural states: Secondary | ICD-10-CM

## 2022-02-18 NOTE — Progress Notes (Signed)
Patient is a 66 year old female with history of panniculitis.  Patient underwent panniculectomy with Dr. Ulice Bold on 12/17/2021.  Patient was found to have developed abdominal wounds at the inferior abdominal incision site.  Patient presents to the clinic for follow-up.  Patient was last seen in the clinic on 02/12/2022.  At that visit, she was doing really well.  On exam, her wound just left of midline measured 3 cm x 1 cm x 0.25 cm.  It appeared to be healing really well.  The wound just right of midline was approximately 4.5 cm x 1 cm x 0.5 cm.  This wound was also healing well.  Wound VAC was discontinued to the wound just left of midline, and patient placed Medihoney over the wound every other day.  Wound VAC was continued to the wound just right of midline.  Today, patient reports she is doing well.  She has no new specific complaints or concerns.  She states that she has been changing the Medihoney to the wound just left of midline herself every other day.  She denies any fevers or chills.  On exam, patient is sitting upright in no acute distress.  Her abdomen is soft and nontender.  The wound just left of midline is approximately 3 cm x 0.5 cm.  It is healing really nicely.  There is no surrounding erythema or drainage.  The wound just right of midline is approximately 4.4 cm x 1 cm.  There is some minor serous drainage from the wound.  There is no surrounding erythema or purulent drainage.  The 2 small pinpoint wounds to the left lateral incision appear to be healing well.  No erythema or drainage noted.  Both wounds were dressed with donated Medihoney, gauze and Medipore tape  Discussed with patient she can take a wound VAC holiday for the next week and apply Medihoney to the wound just right of midline.  Discussed with patient to continue Medihoney to the wound left of midline.  Donated Medihoney was given to the patient.  Discussed with patient to change these every other day and that she can  cover it with gauze and Medipore tape.  I discussed with the patient that she should follow-up next week and we will evaluate if continuing the wound VAC is necessary.  Discussed with patient me she may continue Vaseline daily over the small pinpoint wounds.  Patient to follow-up next week.  Instructed patient to call if she has any questions or concerns.  Dr. Ulice Bold also examined the patient.

## 2022-02-22 ENCOUNTER — Telehealth: Payer: Self-pay

## 2022-02-22 ENCOUNTER — Telehealth: Payer: Self-pay | Admitting: *Deleted

## 2022-02-22 ENCOUNTER — Telehealth: Payer: Self-pay | Admitting: Student

## 2022-02-22 NOTE — Telephone Encounter (Signed)
Pt states her insurance does not cover the Medihoney. She was wondering of the collagen would be ok and we can order that instead through [facility].

## 2022-02-22 NOTE — Telephone Encounter (Signed)
Regina Daniels w/[facility] is calling in needing a order for the pts wound vac and it can be faxed to [phone].

## 2022-02-22 NOTE — Telephone Encounter (Signed)
Tried to call but line was busy

## 2022-02-22 NOTE — Telephone Encounter (Signed)
Received on (02/18/22) via of fax Physician Order from [facility] requesting signature and return.  Given to provider to sign and return.    Physician Order signed and faxed back to [facility].  Confirmation received and copy scanned into the chart.//AB/CMA

## 2022-02-23 NOTE — Telephone Encounter (Signed)
Faxed new orders to [facility] with confirmed receipt.

## 2022-02-25 ENCOUNTER — Telehealth: Payer: Self-pay

## 2022-02-25 NOTE — Telephone Encounter (Signed)
Faxed wound care orders to [facility] with confirmed receipt. Sending to scan

## 2022-02-26 ENCOUNTER — Ambulatory Visit (INDEPENDENT_AMBULATORY_CARE_PROVIDER_SITE_OTHER): Payer: Self-pay | Admitting: Student

## 2022-02-26 ENCOUNTER — Telehealth: Payer: Self-pay

## 2022-02-26 DIAGNOSIS — Z719 Counseling, unspecified: Secondary | ICD-10-CM

## 2022-02-26 DIAGNOSIS — Z9889 Other specified postprocedural states: Secondary | ICD-10-CM

## 2022-02-26 NOTE — Progress Notes (Signed)
Patient is a 66 year old female with history of panniculitis.  Patient underwent panniculectomy with Dr. Ulice Bold on 12/17/2021.  Patient was found to have developed abdominal wounds at the inferior abdominal incision site.  Patient presents to the clinic for follow-up.  Patient was last seen in the clinic on 02/18/2022.  At this visit, she had no new complaints or concerns.  Her wound just left of midline was approximately 3 cm x 0.5 cm, and healing nicely.  Her wound just right of midline was approximately 4.4 cm x 1 cm.  Both wounds were dressed with Medihoney and discussed with the patient to apply Medihoney every other day to her wounds bilaterally and discontinue the wound VAC for a week.  Today, patient reports she is doing well.  She states that she has been changing the Medihoney without any issue every other day.  She denies any new issues with the wounds.  She states that her home health nurse had said that the wounds have been looking good.  Patient also reports she feels like she is getting a small keloid near her umbilicus, and feels she has some firmness to the right lateral abdominal incision scar.  Patient reports she feels her wounds to the left lateral incision are improving, but still gets some drainage.  Patient has no other concerns or issues today.  Chaperone present on exam.  On exam, patient is sitting upright in no acute distress.  Her abdomen is soft and nontender.  Her umbilicus appears viable, there is a small area of irritated skin versus possible keloid.  Her wound to the right of midline is approximately 3.5 cm x 0.25 cm.  It appears to be healing well.  There is no drainage or surrounding cellulitic changes to the skin.  The wound to the left of midline is healing really well, and there appears to be a pinpoint opening that is draining a small amount of serous drainage.  There is no purulent drainage or surrounding erythema.  Wounds to the left lateral abdominal incision appear  to be healing well.  There does appear to be some firmness to the right lateral incision consistent with scarring.  Wounds were cleaned with patient's Vashe and patient's Medihoney was placed over the wounds, with gauze and Medipore tape.  I discussed with the patient that she can transition to collagen sheets with K-Y jelly, Medipore and gauze every other day.  Will order these for the patient.  I discussed with the patient she no longer needs the wound VAC at this time.  I discussed with the patient that she can put silicone sheets over her umbilicus and to the right lateral incision.  Patient purchased 2 x 8 Silagen sheet upfront. I discussed with the patient she can cut this and apply.   Lot number: [id number] Expiration date: 2026-03  I discussed with the patient she could put Vaseline over her wounds if she runs out of the donated Medihoney while she is waiting for the collagen sheets to arrive.  Patient to follow-up in 2 weeks.  I instructed the patient to call if she has any questions or concerns.  Pictures were obtained of the patient and placed in the chart with the patient's or guardian's permission.

## 2022-02-26 NOTE — Telephone Encounter (Signed)
Faxed signed orders as well as new wound care orders to [facility] with confirmed receipt. Will send to scan.

## 2022-03-01 ENCOUNTER — Telehealth: Payer: Self-pay

## 2022-03-01 ENCOUNTER — Telehealth: Payer: Self-pay | Admitting: Plastic Surgery

## 2022-03-01 ENCOUNTER — Ambulatory Visit: Payer: Medicare Other | Admitting: Student

## 2022-03-01 NOTE — Telephone Encounter (Signed)
Pt called to report L side abdominal pain above excision site that started Saturday and worsened throughout the day when mobile. She describes the pain as "stabbing". She stated it pain started to subside yesterday but wanted [place] to be aware. Pt reports no fever, n/v or drainage at incision site. I adv her that I would send a message to [facility] and Dr. Ulice Bold and would get back to her when I received a response. She conveyed understanding.

## 2022-03-01 NOTE — Telephone Encounter (Signed)
I called the patient and she stated she had a stabbing abdominal pain to her left side starting on Friday, which continued to Saturday. She states she pain was mainly positional. Patient reports she took tylenol for the pain on Saturday. Patient reports the pain went away yesterday and she continues to be pain-free today. She denies any fevers. She denies any N/V. She states she is eating and drinking without issue and has been having bowel movements.   Patient also reports her home health nurse today saw a small knot in the area. She denies any overlying erythema. She denies any drainage. She denies any other issues at this time.   I advised the patient to come to the clinic tomorrow to be evaluated. Patient agreed with this plan.   I discussed with the patient to monitor her symptoms, and if she experiences fevers or worsening symptoms or abdominal pain she should contact us or be evaluated in the ER. Patient expressed understanding.

## 2022-03-01 NOTE — Telephone Encounter (Signed)
Olegario Messier w/[facility] is calling in stating that she has gone out to see the pt and pt has a pea size near the L side of her incision and is having L leg pain no redness and it is a off and on thing with the leg pain.  Pt is on Cefalexin (Keflex) for her sinus infection this was prescribed by her PCP.  If you should have any questions please feel free to give Olegario Messier a call back.

## 2022-03-01 NOTE — Telephone Encounter (Signed)
Pt is on the schedule to come in at 8 am tomorrow morning.

## 2022-03-02 ENCOUNTER — Ambulatory Visit: Payer: Medicare Other | Admitting: Student

## 2022-03-02 ENCOUNTER — Ambulatory Visit (INDEPENDENT_AMBULATORY_CARE_PROVIDER_SITE_OTHER): Payer: Medicare Other | Admitting: Student

## 2022-03-02 DIAGNOSIS — Z9889 Other specified postprocedural states: Secondary | ICD-10-CM

## 2022-03-02 NOTE — Progress Notes (Signed)
Patient is a 66 year old female with history of panniculitis.  Patient underwent panniculectomy with Dr. Ulice Bold on 12/17/2021.  Patient was found to have developed abdominal wounds at the inferior abdominal incision site.  Patient presents to the clinic for some abdominal pain she has been having.  Patient denies any changes since we talked on the phone yesterday.  She continues to state that her pain is very positional and is a stabbing pain.  She states that she has been taking Tylenol for the pain.  Patient reports she is still eating and drinking without issue, denies nausea or vomiting and has been having regular bowel movements.  She denies any fevers.  Patient denies any other issues at this time.  Patient reports she is going on a trip to the beach this week.  Chaperone present on exam.  On exam, patient is sitting upright in no acute distress.  Her abdomen was soft and nondistended.  There were no peritoneal signs.  There was mild tenderness to deep palpation just superior of the left aspect of the abdominal incision.  There was a small knot noted in this area, that appears consistent with scarring possibly near a deep suture.  There is no overlying ecchymosis or erythema.  Wounds to her abdominal incision are healing well.  The wound just left of midline has completely healed.  The wound to right of midline is now approximately 2 cm x 0.25 cm.  There is no drainage noted on exam.  Incision is otherwise intact.  Umbilicus incision is healing well.  Patient has silicone sheet over the umbilicus.  The small area of irritation versus keloid appears to be improving.  I discussed with the patient that she can gently massage the area she has been having pain in.  I discussed that this may be a deep suture that is causing this pain in the small little knot.  I discussed with the patient she no longer has to put the collagen over the wound just left of midline, as this has completely healed.  I discussed  with the patient she should continue the collagen dressing to the wound just right of midline.  I discussed with the patient that she should avoid submerging her incision and keep the incision clean dry and covered on her trip.  Patient expressed understanding.  I discussed with the patient to monitor her symptoms and to call us if her symptoms persist or worsen.  I discussed with the patient that if she were to develop irretractable abdominal pain, nausea, vomiting or fever she should be evaluated in the emergency room.  Patient expressed understanding.  Pictures were obtained of the patient and placed in the chart with the patient's or guardian's permission.   Instructed patient to call back if she has any questions or concerns.  Patient to follow-up next Friday.

## 2022-03-03 NOTE — Telephone Encounter (Signed)
Pt seen in-office yesterday. Closing ticket.

## 2022-03-08 ENCOUNTER — Telehealth: Payer: Self-pay

## 2022-03-08 NOTE — Telephone Encounter (Signed)
Faxed signed orders to [facility]; received confirmation fax. Original documents sent to front desk x batch scanning.

## 2022-03-10 ENCOUNTER — Telehealth: Payer: Self-pay | Admitting: *Deleted

## 2022-03-10 ENCOUNTER — Telehealth: Payer: Self-pay

## 2022-03-10 NOTE — Telephone Encounter (Signed)
Received on (03/04/22) via of fax Request for Discharge Orders from [facility].  To be completed and faxed back to [facility].  Given to provider to complete and sign.    Discharge Orders completed,signed and faxed back to [facility].  Confirmation received and copy scanned into the chart.//AB/CMA

## 2022-03-10 NOTE — Telephone Encounter (Signed)
Faxed signed home health orders to [facility]; received confirmation fax. Forwarded original documents to front desk for batch scanning.

## 2022-03-12 ENCOUNTER — Ambulatory Visit (INDEPENDENT_AMBULATORY_CARE_PROVIDER_SITE_OTHER): Payer: Medicare Other | Admitting: Physician Assistant

## 2022-03-12 ENCOUNTER — Encounter: Payer: Self-pay | Admitting: Physician Assistant

## 2022-03-12 DIAGNOSIS — Z9889 Other specified postprocedural states: Secondary | ICD-10-CM

## 2022-03-12 NOTE — Progress Notes (Signed)
This is a pleasant 66 year old female seen in our office for follow-up evaluation.  The patient has a history of panniculitis, she underwent panniculectomy with Dr. Ulice Bold on 12/17/2021.  She has had some abdominal wounds that have been managed in our office closely.  She was last seen in our office on 03/02/2022.  The wounds were appearing better at that time.  She was to continue using the collagen dressings on the wound right of the midline.  She notes she has been doing this.  Since her last office visit she denies any complaints or concerns, she denies any fever abdominal pain, nausea or vomiting.  Chaperone present for exam.  On exam the patient is sitting comfortably in the exam chair no acute distress.  Her abdomen is soft nontender with no fluid collections.  Her incision is clean dry and intact with a punctate wound just left of the midline, no purulence.  Her umbilicus incision is clean dry and intact with no surrounding redness.  Photos taken and placed in the chart today.  Overall the patient is doing very well, she has 1 punctate wound along the left midline abdominal incision that is nearly closed.  She has no infectious symptoms.  Overall very happy with her progress.  I would like to see her 1 last time in our office for repeat evaluation to ensure that all of the wounds have completely healed over.  The patient will follow-up for 1 last visit, she may follow-up sooner as needed.  The patient verbalized understanding and agreement to today's plan and had no further questions or concerns.

## 2022-03-17 ENCOUNTER — Telehealth: Payer: Self-pay | Admitting: Plastic Surgery

## 2022-03-17 NOTE — Telephone Encounter (Signed)
Cathy the homehealth RN is calling to let us know that she went out to see the pt and the wound looks like it is closed and healed, but the dressing did have a little drainage but RN thinks it came from the [place] gel.  Lynden Ang just wanted the provider to know that if the dressing looks wet it maybe a light drainage.

## 2022-03-18 ENCOUNTER — Ambulatory Visit (INDEPENDENT_AMBULATORY_CARE_PROVIDER_SITE_OTHER): Payer: Medicare Other | Admitting: Surgical

## 2022-03-18 DIAGNOSIS — Z9889 Other specified postprocedural states: Secondary | ICD-10-CM

## 2022-03-18 DIAGNOSIS — T8131XA Disruption of external operation (surgical) wound, not elsewhere classified, initial encounter: Secondary | ICD-10-CM

## 2022-03-18 DIAGNOSIS — M793 Panniculitis, unspecified: Secondary | ICD-10-CM

## 2022-03-18 NOTE — Progress Notes (Signed)
   Referring Provider Burton Apley, MD [address] [place],  [place] [address]   CC: No chief complaint on file.     Regina Daniels is an 66 y.o. female.  HPI: Patient is a 66 year old female here for follow-up after panniculectomy with Dr. Ulice Bold on 12/17/2021.  She is approximately 3 months postop.  Patient reports overall she is doing well, reports she does have a small wound of the left abdominal incision that has been draining.  She feels that if it is improving.  She denies any infectious symptoms.  She has been keeping the area covered with gauze and tape.  She reports that she has had some occasional tenderness of her abdomen with increased activity.  Review of Systems General: No fevers or chills  Physical Exam    01/07/2022   12:38 PM 01/04/2022   10:44 AM 12/19/2021    7:32 PM  Vitals with BMI  Height 5\' 5"     Weight 194 lbs    BMI 32.28    Systolic 118 126  Diastolic 78 78 54  Pulse 68 68 72    General:  No acute distress,  Alert and oriented, Non-Toxic, Normal speech and affect Abdomen: Umbilicus incision is intact, umbilicus is viable.  Majority of the abdominal incision is intact, healing well.  She does have a pinpoint wound of the left medial abdominal incision.  There is no surrounding erythema.  I am unable to express any drainage with palpation.  It is nontender to palpation.  No subcutaneous fluid collection noted over the abdomen.  Assessment/Plan Discussed with patient I recommend Vaseline and gauze to the pinpoint opening of her abdomen, we discussed scheduling an additional follow-up or following up as needed.  Patient felt comfortable keeping an eye on the pinpoint opening at home and will call [phone] if she develops any changes or concerns.  We discussed that she does not have any restrictions at this time, may benefit from continued compression until the pinpoint wound has healed.  I do not see any signs of infection on exam.  All of her questions  were answered to her content.  We will plan to see her on an as-needed basis.  [place] Regina Daniels 03/18/2022, 10:50 AM

## 2022-04-01 ENCOUNTER — Telehealth: Payer: Self-pay | Admitting: *Deleted

## 2022-04-01 NOTE — Telephone Encounter (Signed)
Received from [facility] on (03/30/22) via of fax information to inform our office that the patient has been discharged from the agency. No response required.  Given to the provider to see and copy scanned into the chart.//AB/CMA

## 2022-05-11 ENCOUNTER — Other Ambulatory Visit: Payer: Self-pay | Admitting: Obstetrics and Gynecology

## 2022-05-11 DIAGNOSIS — Z9189 Other specified personal risk factors, not elsewhere classified: Secondary | ICD-10-CM

## 2022-06-24 ENCOUNTER — Ambulatory Visit
Admission: RE | Admit: 2022-06-24 | Discharge: 2022-06-24 | Disposition: A | Discharge status: Home / Self Care | Payer: Medicare Other | Source: Ambulatory Visit | Attending: Obstetrics and Gynecology | Admitting: Obstetrics and Gynecology

## 2022-06-24 DIAGNOSIS — Z9189 Other specified personal risk factors, not elsewhere classified: Secondary | ICD-10-CM

## 2022-11-15 LAB — LAB REPORT - SCANNED
A1c: 5.7
EGFR: 54

## 2022-12-31 ENCOUNTER — Other Ambulatory Visit: Payer: Self-pay | Admitting: Internal Medicine

## 2022-12-31 ENCOUNTER — Ambulatory Visit
Admission: RE | Admit: 2022-12-31 | Discharge: 2022-12-31 | Disposition: A | Discharge status: Home / Self Care | Payer: Medicare Other | Source: Ambulatory Visit | Attending: Internal Medicine | Admitting: Internal Medicine

## 2022-12-31 DIAGNOSIS — R0602 Shortness of breath: Secondary | ICD-10-CM

## 2023-01-13 NOTE — Congregational Nurse Program (Unsigned)
Patient c/o shortness of breathe after short walk to deck, I suggested asking PCP about referral to Cardiologist.

## 2023-06-20 ENCOUNTER — Ambulatory Visit (HOSPITAL_BASED_OUTPATIENT_CLINIC_OR_DEPARTMENT_OTHER): Payer: Medicare Other | Admitting: Cardiovascular Disease

## 2023-06-20 ENCOUNTER — Encounter (HOSPITAL_BASED_OUTPATIENT_CLINIC_OR_DEPARTMENT_OTHER): Payer: Self-pay | Admitting: Cardiovascular Disease

## 2023-06-20 VITALS — BP 124/60 | HR 62 | Ht 65.0 in | Wt 199.7 lb

## 2023-06-20 DIAGNOSIS — E039 Hypothyroidism, unspecified: Secondary | ICD-10-CM | POA: Diagnosis not present

## 2023-06-20 DIAGNOSIS — I498 Other specified cardiac arrhythmias: Secondary | ICD-10-CM | POA: Insufficient documentation

## 2023-06-20 DIAGNOSIS — R002 Palpitations: Secondary | ICD-10-CM | POA: Diagnosis not present

## 2023-06-20 DIAGNOSIS — R0609 Other forms of dyspnea: Secondary | ICD-10-CM | POA: Diagnosis not present

## 2023-06-20 DIAGNOSIS — I10 Essential (primary) hypertension: Secondary | ICD-10-CM

## 2023-06-20 HISTORY — DX: Other specified cardiac arrhythmias: I49.8

## 2023-06-20 HISTORY — DX: Essential (primary) hypertension: I10

## 2023-06-20 HISTORY — DX: Other forms of dyspnea: R06.09

## 2023-06-20 NOTE — Progress Notes (Signed)
Cardiology Office Note:  .   Date:  06/20/2023  ID:  Regina Daniels, DOB 1955-09-24, MRN [id number] PCP: Regina Apley, MD  [facility] Providers Cardiologist:  None    History of Present Illness: .   Regina Daniels is a 66 y.o. female with hypertension and hyperlipidemia who is being seen today for the evaluation of exertional dyspnea and palpitations at the request of Regina Apley, MD.  Regina Daniels presents with a chief complaint of palpitations and shortness of breath. These symptoms began in the early summer, initially attributed to allergies, and were managed with an albuterol inhaler. However, the symptoms persisted and became more noticeable, with the patient describing the heart as "about to beat out of [her] chest." The patient was uncertain whether the palpitations were causing the shortness of breath or vice versa.  The patient reported a history of a rapid heart rate, previously managed with atenolol, but this was discontinued due to low blood pressure and a resting heart rate in the forties. Recently, the patient's home blood pressure monitor has been indicating an irregular heartbeat, a new development since the summer.  The palpitations were described as lasting several minutes, up to fifteen minutes, and were constant until they dissipated. These episodes were sometimes accompanied by a sensation of lightheadedness. The patient noted that these episodes could occur even when at rest, but were more likely to occur with exertion, such as walking uphill or keeping pace with others during a walk.  Regina Daniels also reported a slight swelling in the right foot, which improved with rest. There was no associated chest pain or pressure, and no significant shortness of breath when lying down. The patient denied any pain in the legs when walking.  She has a significant family history of heart disease and stroke, with both parents and an older sibling having suffered heart attacks. The  patient's father also had a stroke, and the patient's mother experienced multiple TIAs. The patient's older sister had suffered from debilitating strokes.      ROS:  As per HPI  Studies Reviewed: Marland Kitchen      EKG 06/20/23: Sinus arrhythmia.  Rate 61 bpm.    Risk Assessment/Calculations:             Physical Exam:   VS:  BP 124/60   Pulse 62   Ht 5\' 5"  (1.651 m)   Wt 199 lb 11.2 oz (90.6 kg)   SpO2 97%   BMI 33.23 kg/m  , BMI Body mass index is 33.23 kg/m. GENERAL:  Well appearing HEENT: Pupils equal round and reactive, fundi not visualized, oral mucosa unremarkable NECK:  No jugular venous distention, waveform within normal limits, carotid upstroke brisk and symmetric, no bruits, no thyromegaly LUNGS:  Clear to auscultation bilaterally HEART:  RRR.  PMI not displaced or sustained,S1 and S2 within normal limits, no S3, no S4, no clicks, no rubs, no murmurs ABD:  Flat, positive bowel sounds normal in frequency in pitch, no bruits, no rebound, no guarding, no midline pulsatile mass, no hepatomegaly, no splenomegaly EXT:  2 plus pulses throughout, no edema, no cyanosis no clubbing SKIN:  No rashes no nodules NEURO:  Cranial nerves II through XII grossly intact, motor grossly intact throughout PSYCH:  Cognitively intact, oriented to person place and time  ASSESSMENT AND PLAN: .    # Palpitations and Shortness of Breath # Sinus arrhythmia: Episodes of palpitations and shortness of breath, sometimes associated with exertion. EKG shows sinus arrhythmia. No  chest pain or significant leg swelling. Family history of heart disease and stroke. -Recommend Kardia Mobile device for patient to capture EKG during symptomatic episodes.  They occur to infrequently to reliably catch on an ambulatory monitor -Order labs including CBC, comprehensive metabolic panel, TSH, and magnesium to rule out electrolyte imbalances or thyroid dysfunction. -Advise patient to limit caffeine intake and manage  stressors.  # Coronary Artery Disease Risk:  # Exertional dyspnea:  Given the patient's symptoms and family history, there is a potential risk for coronary artery disease. -Order coronary CT scan to assess for any coronary artery blockages. -Check lipid profile to assess cholesterol levels.  # Hypertension Well controlled on current regimen of hydrochlorothiazide and metoprolol. -Continue current medications.  Follow-up in a few months to review any recorded episodes and results of coronary CT scan.       Signed, Regina Si, MD

## 2023-06-20 NOTE — Patient Instructions (Addendum)
Medication Instructions:  Your physician recommends that you continue on your current medications as directed. Please refer to the Current Medication list given to you today.  *If you need a refill on your cardiac medications before your next appointment, please call your pharmacy*  Lab Work: CBC/CMET/TSH/FT4/MAGNESIUM TODAY   If you have labs (blood work) drawn today and your tests are completely normal, you will receive your results only by: MyChart Message (if you have MyChart) OR A paper copy in the mail If you have any lab test that is abnormal or we need to change your treatment, we will call you to review the results.   Testing/Procedures: Your physician has requested that you have cardiac CT. Cardiac computed tomography (CT) is a painless test that uses an x-ray machine to take clear, detailed pictures of your heart. For further information please visit [email or web address] Please follow instruction sheet as given.  Follow-Up: At [facility], you and your health needs are our priority.  As part of our continuing mission to provide you with exceptional heart care, we have created designated Provider Care Teams.  These Care Teams include your primary Cardiologist (physician) and Advanced Practice Providers (APPs -  Physician Assistants and Nurse Practitioners) who all work together to provide you with the care you need, when you need it.  We recommend signing up for the patient portal called "MyChart".  Sign up information is provided on this After Visit Summary.  MyChart is used to connect with patients for Virtual Visits (Telemedicine).  Patients are able to view lab/test results, encounter notes, upcoming appointments, etc.  Non-urgent messages can be sent to your provider as well.   To learn more about what you can do with MyChart, go to [email or web address].    Your next appointment:   3 month(s)  Provider:   Chilton Si, MD or Gillian Shields, NP    Other  Instructions  Kardia mobile device to track your heart rhythm    Your cardiac CT will be scheduled at one of the below locations:   [facility] [address] [place], [place] [address] [phone]  OR  [facility] [address] [place], [place] [address] [phone]  OR   [facility] [address] [place], [place] [address] [phone]  If scheduled at [facility], please arrive at the [facility] (Entrance C2) of [facility] 30 minutes prior to test start time. You can use the FREE valet parking offered at entrance C (encouraged to control the heart rate for the test)  Proceed to the [facility] Radiology Department (first floor) to check-in and test prep.  All radiology patients and guests should use entrance C2 at [facility], accessed from [facility], even though the hospital's physical address listed is [address]    If scheduled at [facility] or [facility], please arrive 15 mins early for check-in and test prep.  There is spacious parking and easy access to the radiology department from the [facility] entrance. Please enter here and check-in with the desk attendant.   Please follow these instructions carefully (unless otherwise directed):  An IV will be required for this test and Nitroglycerin will be given.  Hold all erectile dysfunction medications at least 3 days (72 hrs) prior to test. (Ie viagra, cialis, sildenafil, tadalafil, etc)   On the Night Before the Test: Be  sure to Drink plenty of water. Do not consume any caffeinated/decaffeinated beverages or chocolate 12 hours prior to your test. Do not take any antihistamines 12 hours prior to your test.  On the Day of the Test: Drink plenty of water until 1 hour prior to the test. Do not eat  any food 1 hour prior to test. You may take your regular medications prior to the test.  Take metoprolol (Lopressor) two hours prior to test. If you take Furosemide/Hydrochlorothiazide/Spironolactone, please HOLD on the morning of the test. FEMALES- please wear underwire-free bra if available, avoid dresses & tight clothing      After the Test: Drink plenty of water. After receiving IV contrast, you may experience a mild flushed feeling. This is normal. On occasion, you may experience a mild rash up to 24 hours after the test. This is not dangerous. If this occurs, you can take Benadryl 25 mg and increase your fluid intake. If you experience trouble breathing, this can be serious. If it is severe call [phone] IMMEDIATELY. If it is mild, please call our office.  We will call to schedule your test 2-4 weeks out understanding that some insurance companies will need an authorization prior to the service being performed.   For more information and frequently asked questions, please visit our website : [email or web address]  For non-scheduling related questions, please contact the cardiac imaging nurse navigator should you have any questions/concerns: Cardiac Imaging Nurse Navigators Direct Office Dial: [phone]   For scheduling needs, including cancellations and rescheduling, please call [place], [phone].

## 2023-06-21 LAB — CBC WITH DIFFERENTIAL/PLATELET
Basophils Absolute: 0 10*3/uL (ref 0.0–0.2)
Basos: 1 %
EOS (ABSOLUTE): 0.1 10*3/uL (ref 0.0–0.4)
Eos: 1 %
Hematocrit: 48.5 % — ABNORMAL HIGH (ref 34.0–46.6)
Hemoglobin: 15.3 g/dL (ref 11.1–15.9)
Immature Grans (Abs): 0 10*3/uL (ref 0.0–0.1)
Immature Granulocytes: 0 %
Lymphocytes Absolute: 2.6 10*3/uL (ref 0.7–3.1)
Lymphs: 32 %
MCH: 29.6 pg (ref 26.6–33.0)
MCHC: 31.5 g/dL (ref 31.5–35.7)
MCV: 94 fL (ref 79–97)
Monocytes Absolute: 0.6 10*3/uL (ref 0.1–0.9)
Monocytes: 7 %
Neutrophils Absolute: 4.8 10*3/uL (ref 1.4–7.0)
Neutrophils: 59 %
Platelets: 294 10*3/uL (ref 150–450)
RBC: 5.17 x10E6/uL (ref 3.77–5.28)
RDW: 12 % (ref 11.7–15.4)
WBC: 8.1 10*3/uL (ref 3.4–10.8)

## 2023-06-21 LAB — COMPREHENSIVE METABOLIC PANEL
ALT: 19 [IU]/L (ref 0–32)
AST: 26 [IU]/L (ref 0–40)
Albumin: 4.8 g/dL (ref 3.9–4.9)
Alkaline Phosphatase: 101 [IU]/L (ref 44–121)
BUN/Creatinine Ratio: 14 (ref 12–28)
BUN: 18 mg/dL (ref 8–27)
Bilirubin Total: 1.1 mg/dL (ref 0.0–1.2)
CO2: 25 mmol/L (ref 20–29)
Calcium: 10.4 mg/dL — ABNORMAL HIGH (ref 8.7–10.3)
Chloride: 101 mmol/L (ref 96–106)
Creatinine, Ser: 1.3 mg/dL — ABNORMAL HIGH (ref 0.57–1.00)
Globulin, Total: 3.1 g/dL (ref 1.5–4.5)
Glucose: 83 mg/dL (ref 70–99)
Potassium: 4.4 mmol/L (ref 3.5–5.2)
Sodium: 141 mmol/L (ref 134–144)
Total Protein: 7.9 g/dL (ref 6.0–8.5)
eGFR: 45 mL/min/{1.73_m2} — ABNORMAL LOW (ref >59)

## 2023-06-21 LAB — TSH: TSH: 0.9 u[IU]/mL (ref 0.450–4.500)

## 2023-06-21 LAB — MAGNESIUM: Magnesium: 1.9 mg/dL (ref 1.6–2.3)

## 2023-06-21 LAB — T4, FREE: Free T4: 1.45 ng/dL (ref 0.82–1.77)

## 2023-06-30 ENCOUNTER — Encounter (HOSPITAL_COMMUNITY): Payer: Self-pay

## 2023-07-01 ENCOUNTER — Telehealth (HOSPITAL_COMMUNITY): Payer: Self-pay | Admitting: *Deleted

## 2023-07-01 NOTE — Telephone Encounter (Signed)
Reaching out to patient to offer assistance regarding upcoming cardiac imaging study; pt verbalizes understanding of appt date/time, parking situation and where to check in, pre-test NPO status and verified current allergies; name and call back number provided for further questions should they arise  Larey Brick RN Navigator Cardiac Imaging [facility] [phone] office [phone] cell  Patient aware to arrive at 2:30 PM

## 2023-07-04 ENCOUNTER — Ambulatory Visit (HOSPITAL_COMMUNITY)
Admission: RE | Admit: 2023-07-04 | Discharge: 2023-07-04 | Disposition: A | Discharge status: Home / Self Care | Payer: Medicare Other | Source: Ambulatory Visit | Attending: Cardiovascular Disease | Admitting: Cardiovascular Disease

## 2023-07-04 DIAGNOSIS — I2510 Atherosclerotic heart disease of native coronary artery without angina pectoris: Secondary | ICD-10-CM | POA: Insufficient documentation

## 2023-07-04 DIAGNOSIS — R002 Palpitations: Secondary | ICD-10-CM | POA: Insufficient documentation

## 2023-07-04 DIAGNOSIS — R0609 Other forms of dyspnea: Secondary | ICD-10-CM | POA: Diagnosis not present

## 2023-07-04 MED ORDER — NITROGLYCERIN 0.4 MG SL SUBL
0.8000 mg | SUBLINGUAL_TABLET | SUBLINGUAL | Status: DC | PRN
  Administered 2023-07-04: 0.8 mg via SUBLINGUAL

## 2023-07-04 MED ORDER — IOHEXOL 350 MG/ML SOLN
95.0000 mL | Freq: Once | INTRAVENOUS | Status: AC | PRN
  Administered 2023-07-04: 95 mL via INTRAVENOUS

## 2023-07-04 MED ORDER — NITROGLYCERIN 0.4 MG SL SUBL
SUBLINGUAL_TABLET | SUBLINGUAL | Status: AC
Start: 2023-07-04 — End: ?
  Filled 2023-07-04: qty 2

## 2023-07-21 ENCOUNTER — Encounter (HOSPITAL_BASED_OUTPATIENT_CLINIC_OR_DEPARTMENT_OTHER): Payer: Self-pay | Admitting: Cardiovascular Disease

## 2023-07-21 NOTE — Telephone Encounter (Signed)
 Pt's Kardia notes/results. Let me know if you have any recommendations. Thank you!

## 2023-07-22 ENCOUNTER — Telehealth (HOSPITAL_BASED_OUTPATIENT_CLINIC_OR_DEPARTMENT_OTHER): Payer: Self-pay

## 2023-07-22 DIAGNOSIS — I10 Essential (primary) hypertension: Secondary | ICD-10-CM

## 2023-07-22 NOTE — Telephone Encounter (Signed)
-----   Message from Reche GORMAN Finder sent at 07/22/2023  8:18 AM EST ----- CBC with no evidence of anemia nor infection.  Normal thyroid , liver, electrolytes. Kidney function decreased from prior, recommend increasing oral fluid intake. Repeat BMP in 1-2 weeks to ensure improvement in kidney function.

## 2023-08-04 ENCOUNTER — Encounter (HOSPITAL_BASED_OUTPATIENT_CLINIC_OR_DEPARTMENT_OTHER): Payer: Self-pay

## 2023-08-04 LAB — BASIC METABOLIC PANEL
BUN/Creatinine Ratio: 16 (ref 12–28)
BUN: 16 mg/dL (ref 8–27)
CO2: 23 mmol/L (ref 20–29)
Calcium: 9.9 mg/dL (ref 8.7–10.3)
Chloride: 101 mmol/L (ref 96–106)
Creatinine, Ser: 1.02 mg/dL — ABNORMAL HIGH (ref 0.57–1.00)
Glucose: 60 mg/dL — ABNORMAL LOW (ref 70–99)
Potassium: 4.5 mmol/L (ref 3.5–5.2)
Sodium: 142 mmol/L (ref 134–144)
eGFR: 61 mL/min/{1.73_m2} (ref >59)

## 2023-09-19 ENCOUNTER — Ambulatory Visit (INDEPENDENT_AMBULATORY_CARE_PROVIDER_SITE_OTHER): Payer: Medicare Other | Admitting: Family

## 2023-09-19 ENCOUNTER — Encounter (HOSPITAL_BASED_OUTPATIENT_CLINIC_OR_DEPARTMENT_OTHER): Payer: Self-pay | Admitting: Family

## 2023-09-19 VITALS — BP 128/72 | HR 55 | Ht 65.5 in | Wt 199.7 lb

## 2023-09-19 DIAGNOSIS — R0609 Other forms of dyspnea: Secondary | ICD-10-CM | POA: Diagnosis not present

## 2023-09-19 DIAGNOSIS — R600 Localized edema: Secondary | ICD-10-CM | POA: Diagnosis not present

## 2023-09-19 DIAGNOSIS — I10 Essential (primary) hypertension: Secondary | ICD-10-CM

## 2023-09-19 DIAGNOSIS — I2510 Atherosclerotic heart disease of native coronary artery without angina pectoris: Secondary | ICD-10-CM

## 2023-09-19 DIAGNOSIS — E785 Hyperlipidemia, unspecified: Secondary | ICD-10-CM

## 2023-09-19 NOTE — Patient Instructions (Addendum)
 Medication Instructions:    START Furosemide 20mg  as needed for leg swelling and fluid retention.  Please call our office if your are taking more than twice per week.   *If you need a refill on your cardiac medications before your next appointment, please call your pharmacy*  Testing/Procedures: Your cardiac CTA showed minimal nonobstructive coronary artery disease. There was 0-24% stenosis of one of your heart arteries.    Follow-Up: At [facility], you and your health needs are our priority.  As part of our continuing mission to provide you with exceptional heart care, we have created designated Provider Care Teams.  These Care Teams include your primary Cardiologist (physician) and Advanced Practice Providers (APPs -  Physician Assistants and Nurse Practitioners) who all work together to provide you with the care you need, when you need it.  We recommend signing up for the patient portal called "MyChart".  Sign up information is provided on this After Visit Summary.  MyChart is used to connect with patients for Virtual Visits (Telemedicine).  Patients are able to view lab/test results, encounter notes, upcoming appointments, etc.  Non-urgent messages can be sent to your provider as well.   To learn more about what you can do with MyChart, go to [email or web address].    Your next appointment:   6 month(s)  Provider:   Chilton Si, MD, Eligha Bridegroom, NP, or Gillian Shields, NP    Other Instructions     To prevent or reduce lower extremity swelling: Eat a low salt diet. Salt makes the body hold onto extra fluid which causes swelling. Sit with legs elevated. For example, in the recliner or on an ottoman.  Wear knee-high compression stockings during the daytime. Ones labeled 15-20 mmHg provide good compression.

## 2023-09-19 NOTE — Progress Notes (Signed)
 Cardiology Office Note:  .   Date:  09/19/2023  ID:  Regina Daniels, DOB 1955/07/31, MRN [id number] PCP: Burton Apley, MD  [facility] Providers Cardiologist:  Chilton Si, MD    History of Present Illness: .   Regina Daniels is a 68 y.o. female with history of nonobstructive CAD, HTN, HLD.  Cardiac CTA 06/2023 with minimal 0-24% soft plaque in the LAD with calcium score of 0.  Rosuvastatin 10 mg daily recommended.  Presents today for follow up. Since last seen discontinued Spironolactone (originally prescribed for hidradenitis) which resolved her lightheadedness, palpitations. Did have one recurrent episode while walking through [employer] of feeling dizzy, weak but notes poor PO intake that day. Reviewed CTA in detail. Reports no shortness of breath nor dyspnea on exertion. Reports no chest pain, pressure, or tightness. No  orthopnea, PND. Mild bilateral LE edema worse by end of day. Reports no palpitations.    ROS: Please see the history of present illness.    All other systems reviewed and are negative.   Studies Reviewed: .        Cardiac Studies & Procedures   ______________________________________________________________________________________________          CT SCANS  CT CORONARY MORPH W/CTA COR W/SCORE 07/04/2023  Addendum 07/17/2023  9:52 PM ADDENDUM REPORT: 07/17/2023 21:50  EXAM: OVER-READ INTERPRETATION  CT CHEST  The following report is an over-read performed by radiologist Dr. Alcide Clever of [facility] on 07/17/2023. This over-read does not include interpretation of cardiac or coronary anatomy or pathology. The coronary calcium score/coronary CTA interpretation by the cardiologist is attached.  COMPARISON:  None.  FINDINGS: Cardiovascular: There are no significant extracardiac vascular findings.  Mediastinum/Nodes: There are no enlarged lymph nodes within the visualized mediastinum.  Lungs/Pleura: There is no pleural  effusion. The visualized lungs appear clear.  Upper abdomen: No significant findings in the visualized upper abdomen.  Musculoskeletal/Chest wall: No chest wall mass or suspicious osseous findings within the visualized chest.  IMPRESSION: No significant extracardiac findings within the visualized chest.   Electronically Signed By: Alcide Clever M.D. On: 07/17/2023 21:50  Narrative CLINICAL DATA:  68 yo female with exertional dyspnea  EXAM: Cardiac/Coronary CTA  TECHNIQUE: A non-contrast, gated CT scan was obtained with axial slices of 3 mm through the heart for calcium scoring. Calcium scoring was performed using the Agatston method. A 120 kV prospective, gated, contrast cardiac scan was obtained. Gantry rotation speed was 250 msecs and collimation was 0.6 mm. Two sublingual nitroglycerin tablets (0.8 mg) were given. The 3D data set was reconstructed in 5% intervals of the 35-75% of the R-R cycle. Diastolic phases were analyzed on a dedicated workstation using MPR, MIP, and VRT modes. The patient received 95 cc of contrast.  FINDINGS: Image quality: Excellent.  Noise artifact is: Limited.  Coronary Arteries:  Normal coronary origin.  Right dominance.  Left main: The left main is a large caliber vessel with a normal take off from the left coronary cusp that bifurcates to form a left anterior descending artery and a left circumflex artery. There is no plaque or stenosis.  Left anterior descending artery: The LAD has minimal (0-24) soft plaque in the proximal vessel. The LAD gives off 1 patent diagonal branch.  Left circumflex artery: The LCX is non-dominant and patent with no evidence of plaque or stenosis. The LCX gives off 2 patent obtuse marginal branches.  Right coronary artery: The RCA is dominant with normal take off from the  right coronary cusp. There is no evidence of plaque or stenosis. The RCA terminates as a PDA and right posterolateral branch  without evidence of plaque or stenosis.  Right Atrium: Right atrial size is within normal limits.  Right Ventricle: The right ventricular cavity is within normal limits.  Left Atrium: Left atrial size is normal in size with no left atrial appendage filling defect.  Left Ventricle: The ventricular cavity size is within normal limits.  Pulmonary arteries: Normal in size.  Pulmonary veins: Normal pulmonary venous drainage.  Pericardium: Normal thickness without significant effusion or calcium present.  Cardiac valves: The aortic valve is trileaflet without significant calcification. The mitral valve is normal without significant calcification.  Aorta: Normal caliber without significant disease.  Extra-cardiac findings: See attached radiology report for non-cardiac structures.  IMPRESSION: 1. Coronary calcium score of 0. This was 0 percentile for age-, sex, and race-matched controls.  2. Total plaque volume 0 mm3 which is 0 percentile for age- and sex-matched controls (calcified plaque 0 mm3; non-calcified plaque 0 mm3). TPV is (0).  3. Normal coronary origin with right dominance.  4. Minimal (0-24) plaque in the proximal LAD.  RECOMMENDATIONS: CAD-RADS 1: Minimal non-obstructive CAD (0-24%). Consider non-atherosclerotic causes of chest pain. Consider preventive therapy and risk factor modification.  Olga Millers, MD  Electronically Signed: By: Olga Millers M.D. On: 07/05/2023 07:00     ______________________________________________________________________________________________      Risk Assessment/Calculations:             Physical Exam:   VS:  BP 128/72   Pulse (!) 55   Ht 5' 5.5" (1.664 m)   Wt 199 lb 11.2 oz (90.6 kg)   SpO2 96%   BMI 32.73 kg/m    Wt Readings from Last 3 Encounters:  09/19/23 199 lb 11.2 oz (90.6 kg)  06/20/23 199 lb 11.2 oz (90.6 kg)  01/07/22 194 lb (88 kg)    GEN: Well nourished, well developed in no acute  distress NECK: No JVD; No carotid bruits CARDIAC: RRR, no murmurs, rubs, gallops RESPIRATORY:  Clear to auscultation without rales, wheezing or rhonchi  ABDOMEN: Soft, non-tender, non-distended EXTREMITIES:  Non pitting bilateral pretibial edema; No deformity   ASSESSMENT AND PLAN: .    Nonobstructive CAD / HLD, LDL goal <70 - CTA 06/2023 with 0-24% noncalcified stenosis of LAD. GDMT Rosuvastatin 10mg  daily. Recommend aiming for 150 minutes of moderate intensity activity per week and following a heart healthy diet.   Given information on Right Start exercise program.  HTN - BP well controlled. Continue current antihypertensive regimen hydrochlorothiazide 12.5mg  daily, toprol 25mg  daily.  LE edema - Worse by end of day and more noticeable since stopping Spironolactone (stopped due to palpitations, dizziness). Rx Lasix 20mg  PRN for LE edema. She will contact us if using more than twice per week. Encouraged low salt diet, leg elevation, compression stockings.        Dispo: follow up in 6mos  Signed, Alver Sorrow, NP

## 2023-09-22 ENCOUNTER — Encounter (HOSPITAL_BASED_OUTPATIENT_CLINIC_OR_DEPARTMENT_OTHER): Payer: Self-pay

## 2023-09-22 MED ORDER — FUROSEMIDE 20 MG PO TABS: ORAL_TABLET | ORAL | 3 refills | Status: AC

## 2023-12-29 ENCOUNTER — Encounter (HOSPITAL_BASED_OUTPATIENT_CLINIC_OR_DEPARTMENT_OTHER): Payer: Self-pay

## 2023-12-29 ENCOUNTER — Ambulatory Visit: Attending: Family

## 2023-12-29 DIAGNOSIS — R002 Palpitations: Secondary | ICD-10-CM

## 2023-12-29 NOTE — Progress Notes (Unsigned)
Enrolled for [employer] to mail a ZIO XT long term holter monitor to the patients address on file.   Dr. Oval Linsey to read.

## 2024-02-08 DIAGNOSIS — R002 Palpitations: Secondary | ICD-10-CM | POA: Diagnosis not present

## 2024-02-09 ENCOUNTER — Ambulatory Visit (HOSPITAL_BASED_OUTPATIENT_CLINIC_OR_DEPARTMENT_OTHER): Payer: Self-pay | Admitting: Family

## 2024-03-30 ENCOUNTER — Ambulatory Visit (HOSPITAL_BASED_OUTPATIENT_CLINIC_OR_DEPARTMENT_OTHER): Admitting: Family

## 2024-03-30 ENCOUNTER — Encounter (HOSPITAL_BASED_OUTPATIENT_CLINIC_OR_DEPARTMENT_OTHER): Payer: Self-pay | Admitting: Family

## 2024-03-30 VITALS — BP 130/80 | HR 65 | Ht 65.5 in | Wt 203.7 lb

## 2024-03-30 DIAGNOSIS — I2510 Atherosclerotic heart disease of native coronary artery without angina pectoris: Secondary | ICD-10-CM | POA: Diagnosis not present

## 2024-03-30 DIAGNOSIS — I10 Essential (primary) hypertension: Secondary | ICD-10-CM | POA: Diagnosis not present

## 2024-03-30 DIAGNOSIS — E039 Hypothyroidism, unspecified: Secondary | ICD-10-CM

## 2024-03-30 DIAGNOSIS — E785 Hyperlipidemia, unspecified: Secondary | ICD-10-CM

## 2024-03-30 DIAGNOSIS — E539 Vitamin B deficiency, unspecified: Secondary | ICD-10-CM

## 2024-03-30 NOTE — Patient Instructions (Addendum)
 Medication Instructions:   Your physician recommends that you continue on your current medications as directed. Please refer to the Current Medication list given to you today.   *If you need a refill on your cardiac medications before your next appointment, please call your pharmacy*  Lab Work:  TODAY!!!   If you have labs (blood work) drawn today and your tests are completely normal, you will receive your results only by: MyChart Message (if you have MyChart) OR A paper copy in the mail If you have any lab test that is abnormal or we need to change your treatment, we will call you to review the results.  Testing/Procedures:  None ordered.  Follow-Up: At [facility], you and your health needs are our priority.  As part of our continuing mission to provide you with exceptional heart care, our providers are all part of one team.  This team includes your primary Cardiologist (physician) and Advanced Practice Providers or APPs (Physician Assistants and Nurse Practitioners) who all work together to provide you with the care you need, when you need it.  Your next appointment:   6 month(s)  Provider:   Annabella Scarce, MD, Rosaline Bane, NP, or Reche Finder, NP    We recommend signing up for the patient portal called MyChart.  Sign up information is provided on this After Visit Summary.  MyChart is used to connect with patients for Virtual Visits (Telemedicine).  Patients are able to view lab/test results, encounter notes, upcoming appointments, etc.  Non-urgent messages can be sent to your provider as well.   To learn more about what you can do with MyChart, go to [email or web address].   Other Instructions  Your physician wants you to follow-up in: 6  months. You will receive a reminder letter in the mail two months in advance. If you don't receive a letter, please call our office to schedule the follow-up appointment.          You may take an additional half  tablet of your Metoprolol as needed for palpitations.   If you start Spironolactone , would recommend reducing to either one tablet or stopping Hydrochlorothiazide. If you make that change, if you will check blood pressure once per day and send us  a log one week after that change.   To prevent palpitations: Make sure you are adequately hydrated.  Avoid and/or limit caffeine containing beverages like soda or tea. Exercise regularly.  Manage stress well. Some over the counter medications can cause palpitations such as Benadryl , AdvilPM, TylenolPM. Regular Advil  or Tylenol  do not cause palpitations.

## 2024-03-30 NOTE — Progress Notes (Unsigned)
 Cardiology Office Note:  .   Date:  04/01/2024  ID:  Regina Daniels, DOB February 24, 1956, MRN [id number] PCP: Henry Ingle, MD  [facility] Providers Cardiologist:  Annabella Scarce, MD    History of Present Illness: .   Regina Daniels is a 68 y.o. female with history of nonobstructive CAD, HTN, HLD.  Cardiac CTA 06/2023 with minimal 0-24% soft plaque in the LAD with calcium  score of 0.  Rosuvastatin  10 mg daily recommended.  She was seen 09/19/23 doing overall well from cardiac perspective.  She was provided Lasix  20 mg as needed for intermittent lower extremity edema.  She subsequently contacted the office in 12/2023  noting palpitations.  Subsequent monitor predominant normal sinus rhythm average heart rate 60 bpm.  There were 51 episodes of SVT lasting up to 28.3 episodes with some triggered episodes associate with SVT and others with normal sinus rhythm.  Due to baseline bradycardia she was not recommended to increase metoprolol.  If she had significant palpitations she was recommended to follow-up with EP.  She noted she was feeling overall well and deferred EP follow-up.  Presents today for follow up. Notes more palpitations after some stress in June related to her grandson graduating. No significant alcohol intake. No reported dehydration. Seeing Vascular and upcoming ablation as more venous insufficiency. She is using Lasix  infrequently. Sinc elast seen has been taking off Levothyroxine  as her thyroid  labs were better. Followed by primary care. No chest pain, exertional dyspnea.   ROS: Please see the history of present illness.    All other systems reviewed and are negative.   Studies Reviewed: .        Cardiac Studies & Procedures   ______________________________________________________________________________________________        SHERRILEE  LONG TERM MONITOR (3-14 DAYS) 02/08/2024  Narrative 14 Day Zio Monitor  Quality: Fair.  Baseline artifact. Predominant rhythm:  sinus rhythm Average heart rate: 60 bpm Max heart rate: 109 bpm Min heart rate: 39 bpm Pauses >2.5 seconds: none  Occasional (3.4%) PACs Rare (<1%) PVCs  1 episode of NSVT lasting 5 beats 51 episodes of SVT lasting up to 28.3 seconds.  Tiffany C. Scarce, MD, [facility] 02/08/2024 6:43 PM   CT SCANS  CT CORONARY MORPH W/CTA COR W/SCORE 07/04/2023  Addendum 07/17/2023  9:52 PM ADDENDUM REPORT: 07/17/2023 21:50  EXAM: OVER-READ INTERPRETATION  CT CHEST  The following report is an over-read performed by radiologist Dr. Oneil Devonshire of [facility] on 07/17/2023. This over-read does not include interpretation of cardiac or coronary anatomy or pathology. The coronary calcium  score/coronary CTA interpretation by the cardiologist is attached.  COMPARISON:  None.  FINDINGS: Cardiovascular: There are no significant extracardiac vascular findings.  Mediastinum/Nodes: There are no enlarged lymph nodes within the visualized mediastinum.  Lungs/Pleura: There is no pleural effusion. The visualized lungs appear clear.  Upper abdomen: No significant findings in the visualized upper abdomen.  Musculoskeletal/Chest wall: No chest wall mass or suspicious osseous findings within the visualized chest.  IMPRESSION: No significant extracardiac findings within the visualized chest.   Electronically Signed By: Oneil Devonshire M.D. On: 07/17/2023 21:50  Narrative CLINICAL DATA:  68 yo female with exertional dyspnea  EXAM: Cardiac/Coronary CTA  TECHNIQUE: A non-contrast, gated CT scan was obtained with axial slices of 3 mm through the heart for calcium  scoring. Calcium  scoring was performed using the Agatston method. A 120 kV prospective, gated, contrast cardiac scan was obtained. Gantry rotation speed was 250 msecs and collimation was 0.6  mm. Two sublingual nitroglycerin  tablets (0.8 mg) were given. The 3D data set was reconstructed in 5% intervals of the 35-75% of the  R-R cycle. Diastolic phases were analyzed on a dedicated workstation using MPR, MIP, and VRT modes. The patient received 95 cc of contrast.  FINDINGS: Image quality: Excellent.  Noise artifact is: Limited.  Coronary Arteries:  Normal coronary origin.  Right dominance.  Left main: The left main is a large caliber vessel with a normal take off from the left coronary cusp that bifurcates to form a left anterior descending artery and a left circumflex artery. There is no plaque or stenosis.  Left anterior descending artery: The LAD has minimal (0-24) soft plaque in the proximal vessel. The LAD gives off 1 patent diagonal branch.  Left circumflex artery: The LCX is non-dominant and patent with no evidence of plaque or stenosis. The LCX gives off 2 patent obtuse marginal branches.  Right coronary artery: The RCA is dominant with normal take off from the right coronary cusp. There is no evidence of plaque or stenosis. The RCA terminates as a PDA and right posterolateral branch without evidence of plaque or stenosis.  Right Atrium: Right atrial size is within normal limits.  Right Ventricle: The right ventricular cavity is within normal limits.  Left Atrium: Left atrial size is normal in size with no left atrial appendage filling defect.  Left Ventricle: The ventricular cavity size is within normal limits.  Pulmonary arteries: Normal in size.  Pulmonary veins: Normal pulmonary venous drainage.  Pericardium: Normal thickness without significant effusion or calcium  present.  Cardiac valves: The aortic valve is trileaflet without significant calcification. The mitral valve is normal without significant calcification.  Aorta: Normal caliber without significant disease.  Extra-cardiac findings: See attached radiology report for non-cardiac structures.  IMPRESSION: 1. Coronary calcium  score of 0. This was 0 percentile for age-, sex, and race-matched controls.  2. Total  plaque volume 0 mm3 which is 0 percentile for age- and sex-matched controls (calcified plaque 0 mm3; non-calcified plaque 0 mm3). TPV is (0).  3. Normal coronary origin with right dominance.  4. Minimal (0-24) plaque in the proximal LAD.  RECOMMENDATIONS: CAD-RADS 1: Minimal non-obstructive CAD (0-24%). Consider non-atherosclerotic causes of chest pain. Consider preventive therapy and risk factor modification.  Redell Shallow, MD  Electronically Signed: By: Redell Shallow M.D. On: 07/05/2023 07:00     ______________________________________________________________________________________________        Risk Assessment/Calculations:            Physical Exam:   VS:  BP 130/80   Pulse 65   Ht 5' 5.5 (1.664 m)   Wt 203 lb 11.2 oz (92.4 kg)   SpO2 97%   BMI 33.38 kg/m    Wt Readings from Last 3 Encounters:  03/30/24 203 lb 11.2 oz (92.4 kg)  09/19/23 199 lb 11.2 oz (90.6 kg)  06/20/23 199 lb 11.2 oz (90.6 kg)    GEN: Well nourished, well developed in no acute distress NECK: No JVD; No carotid bruits CARDIAC: RRR, no murmurs, rubs, gallops RESPIRATORY:  Clear to auscultation without rales, wheezing or rhonchi  ABDOMEN: Soft, non-tender, non-distended EXTREMITIES:  Non pitting bilateral pretibial edema; No deformity   ASSESSMENT AND PLAN: .    Medication management - notes her dermatologist may be considering addition of spironolactone . To prevent previous adverse effects of dehydration, lightheadedness discussed if she elects ot start Spironolactone  could consider reducing or stopping hydrochlorothiazide with monitoring of her BP at home.  Preop -  pending blethoplaspy. Exercise tolerance >4 METS. Per AHA/ACC guidelines, she is deemed acceptable risk for the planned procedure without additional cardiovascular testing. Wi  Nonobstructive CAD / HLD, LDL goal <70 - CTA 06/2023 with 0-24% noncalcified stenosis of LAD. GDMT Rosuvastatin  10mg  daily. Recommend aiming for 150  minutes of moderate intensity activity per week and following a heart healthy diet.  CMET, direct LDL today.  Vitamin B deficiency - vitamin B today. If abnormal she will follow up with PCP.   Hypothyroidism - notes weight gain, PCP stopped levothyroxine . Update thyroid  panel. If abnormal she will follow up with PCP.   HTN - BP well controlled. Continue current antihypertensive regimen hydrochlorothiazide 12.5mg  daily, toprol 25mg  daily. Discussed to monitor BP at home at least 2 hours after medications and sitting for 5-10 minutes.   LE edema - Presently prescribed Lasix  20mg  PRN for LE edema. Following with vascular for venous insufficiency with further treatment upcoming. Encouraged low salt diet, leg elevation, compression stockings.        Dispo: follow up in 6 mos  Signed, Reche GORMAN Finder, NP

## 2024-03-31 LAB — COMPREHENSIVE METABOLIC PANEL WITH GFR
ALT: 13 IU/L (ref 0–32)
AST: 24 IU/L (ref 0–40)
Albumin: 4.5 g/dL (ref 3.9–4.9)
Alkaline Phosphatase: 95 IU/L (ref 44–121)
BUN/Creatinine Ratio: 15 (ref 12–28)
BUN: 15 mg/dL (ref 8–27)
Bilirubin Total: 1.1 mg/dL (ref 0.0–1.2)
CO2: 26 mmol/L (ref 20–29)
Calcium: 9.9 mg/dL (ref 8.7–10.3)
Chloride: 100 mmol/L (ref 96–106)
Creatinine, Ser: 0.97 mg/dL (ref 0.57–1.00)
Globulin, Total: 2.8 g/dL (ref 1.5–4.5)
Glucose: 76 mg/dL (ref 70–99)
Potassium: 4 mmol/L (ref 3.5–5.2)
Sodium: 142 mmol/L (ref 134–144)
Total Protein: 7.3 g/dL (ref 6.0–8.5)
eGFR: 64 mL/min/1.73 (ref >59)

## 2024-03-31 LAB — THYROID PANEL WITH TSH
Free Thyroxine Index: 2 (ref 1.2–4.9)
T3 Uptake Ratio: 26 % (ref 24–39)
T4, Total: 7.8 ug/dL (ref 4.5–12.0)
TSH: 1.19 u[IU]/mL (ref 0.450–4.500)

## 2024-03-31 LAB — VITAMIN B12: Vitamin B-12: 464 pg/mL (ref 232–1245)

## 2024-03-31 LAB — LDL CHOLESTEROL, DIRECT: LDL Direct: 68 mg/dL (ref 0–99)

## 2024-04-01 ENCOUNTER — Ambulatory Visit (HOSPITAL_BASED_OUTPATIENT_CLINIC_OR_DEPARTMENT_OTHER): Payer: Self-pay | Admitting: Family

## 2024-04-19 IMAGING — DX DG CHEST 1V PORT
1 series · 1 of 1 positions shown · non-contrast
Comparison: 03/31/2011

CLINICAL DATA: Seizure yesterday.

EXAM:
PORTABLE CHEST 1 VIEW

[chest]
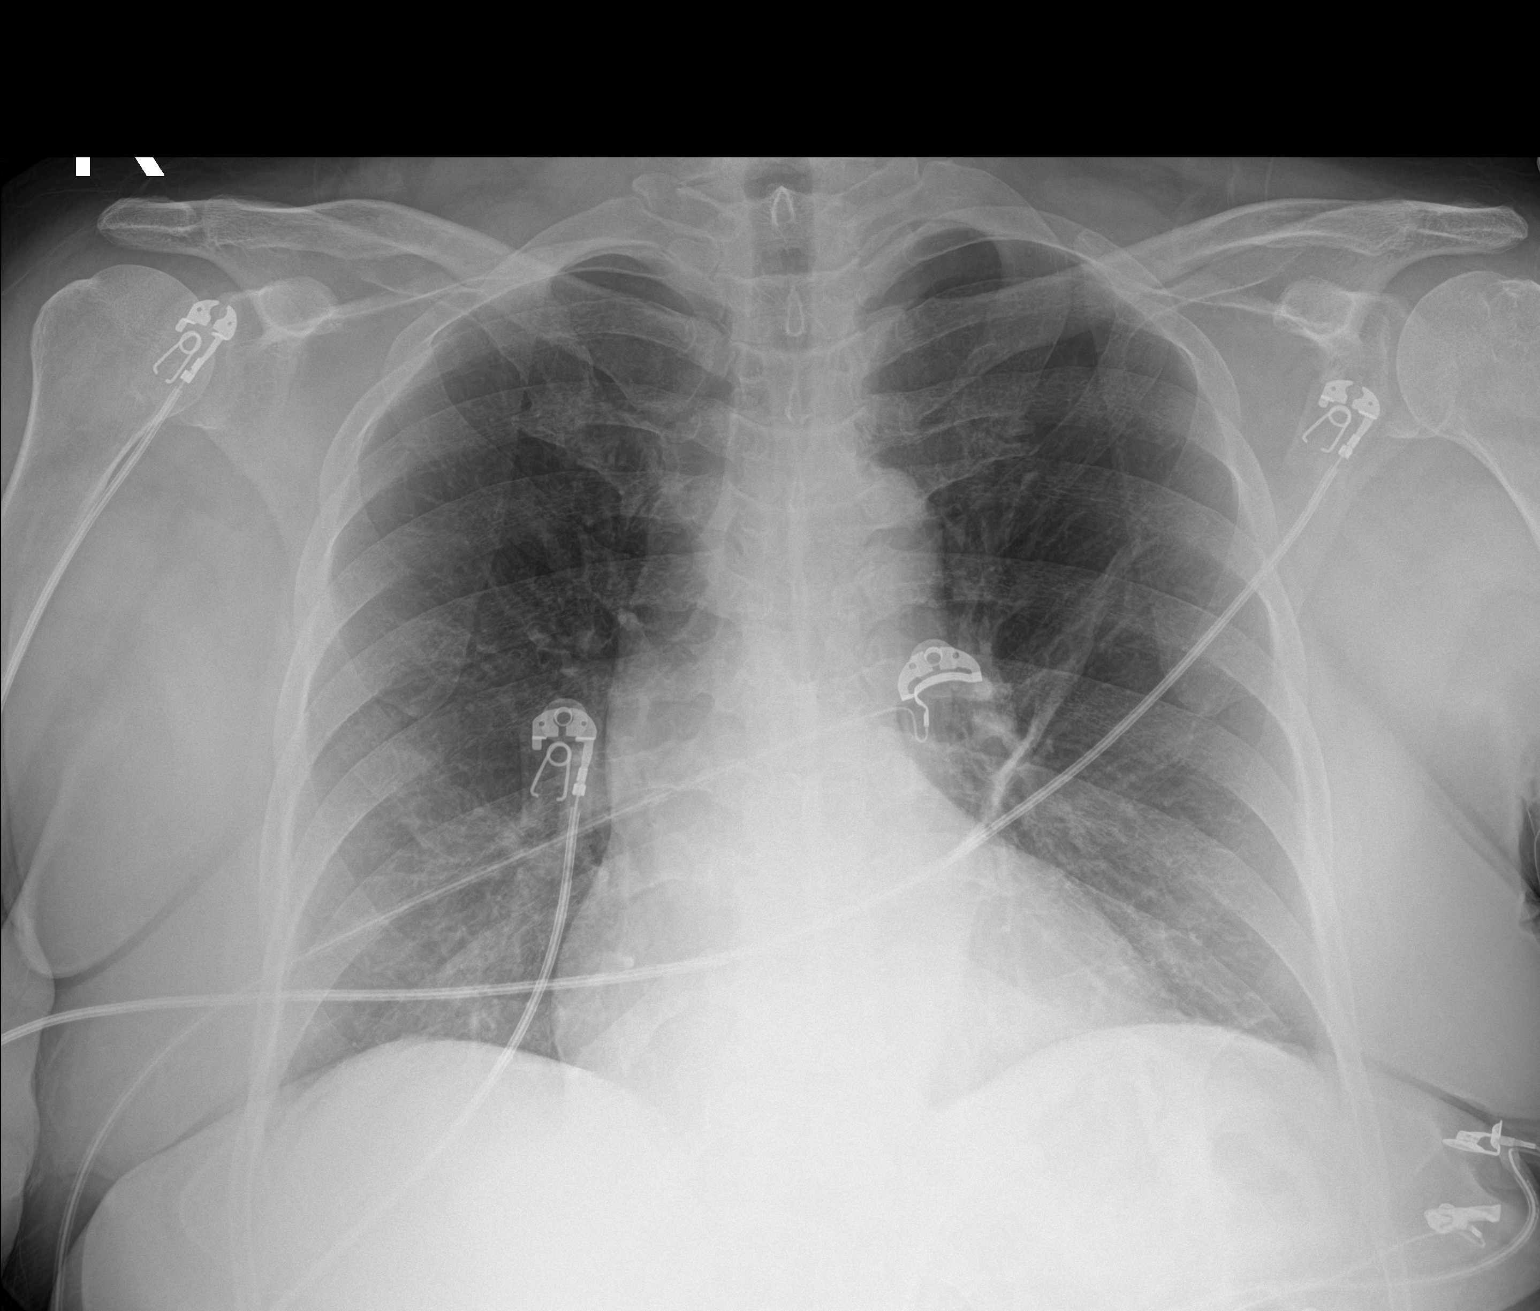

[1 of 1 positions shown; findings below may reference images not displayed]

FINDINGS: Numerous leads and wires project over the chest. Midline trachea.
Normal heart size. No pleural effusion or pneumothorax. Suspect
interval left perihilar scarring. This area is suboptimally
evaluated secondary to overlying wire and lead artifacts. Clear
right lung.
IMPRESSION: No acute cardiopulmonary disease.

Suspect interval left perihilar scarring. Recommend PA and lateral
radiographs after removal of all EKG wires and leads.

## 2024-04-19 IMAGING — CT CT ABD-PELV W/ CM
2 of 3 series · 16 of 46 positions shown, 18 images · IV contrast (APPLIED)
Comparison: CT abdomen pelvis 02/03/2012

CLINICAL DATA: Abdominal pain, post-op hypotensive, recent abd
surgery

EXAM:
CT ABDOMEN AND PELVIS WITH CONTRAST
TECHNIQUE: Multidetector CT imaging of the abdomen and pelvis was performed
using the standard protocol following bolus administration of
intravenous contrast.

[Series 3: abd/ pelvis 5.0 i30f 2 · axial · 0.79mm/px · z∈[-893,-463]mm · 13 of 100 slices shown, 15 images]
[im 7/100  soft-tissue]
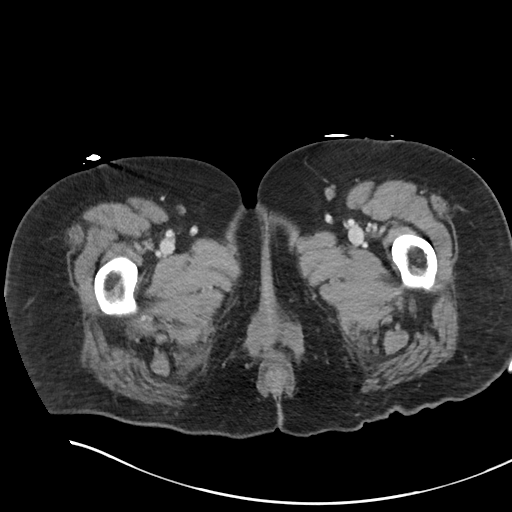
[im 7/100  bone]
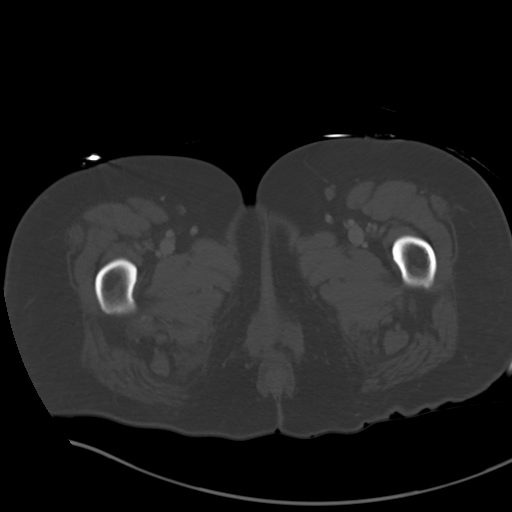
[im 13/100  soft-tissue]
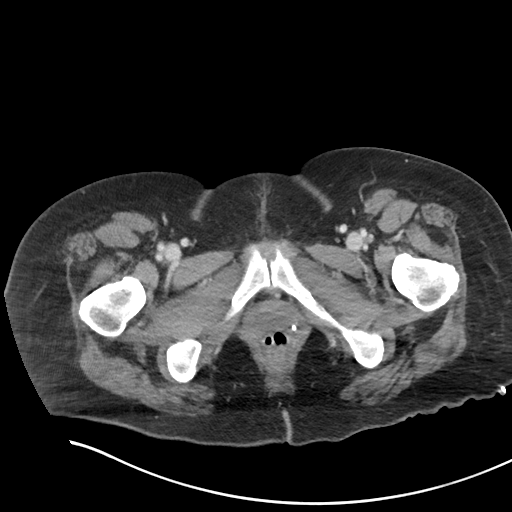
[im 20/100  soft-tissue]
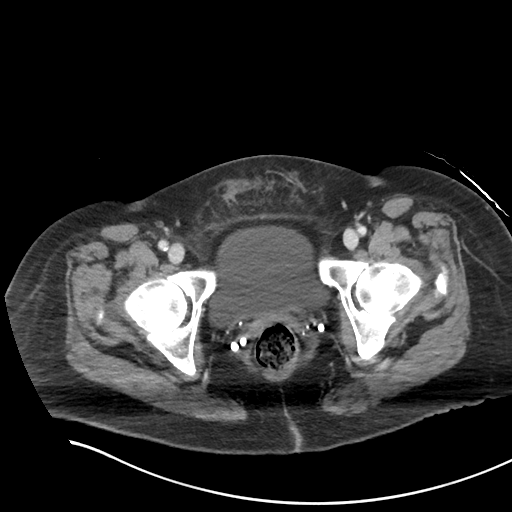
[im 29/100  soft-tissue]
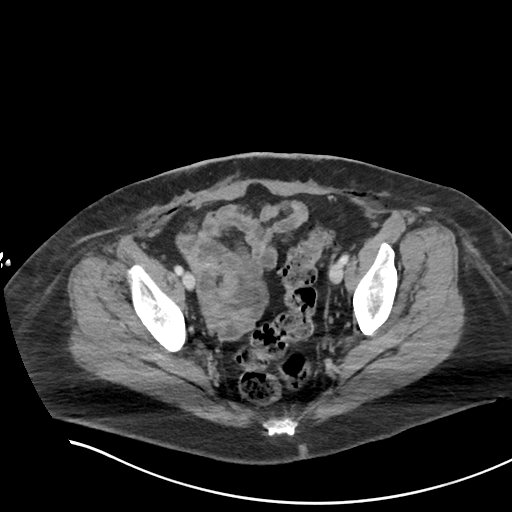
[im 36/100  soft-tissue]
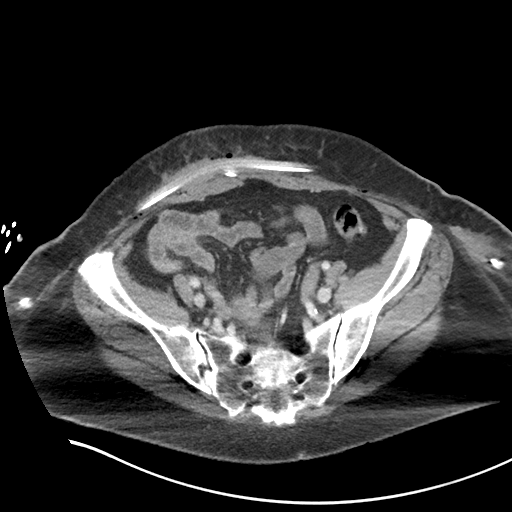
[im 42/100  soft-tissue]
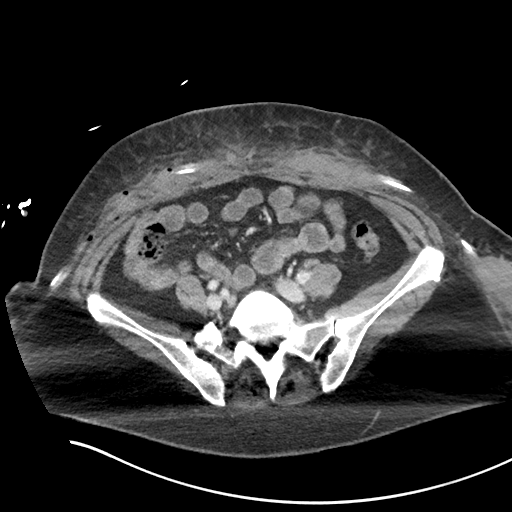
[im 52/100  soft-tissue]
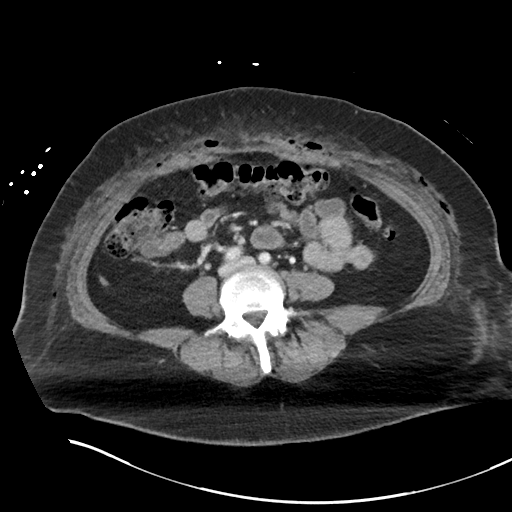
[im 58/100  soft-tissue]
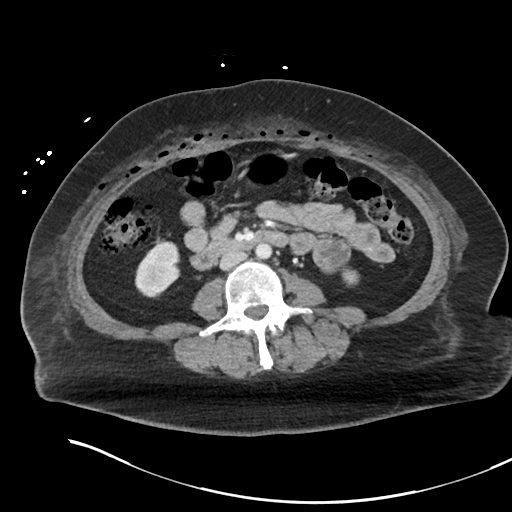
[im 64/100  soft-tissue]
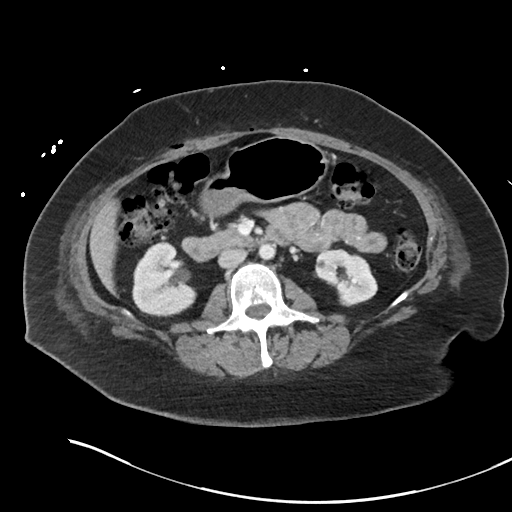
[im 64/100  bone]
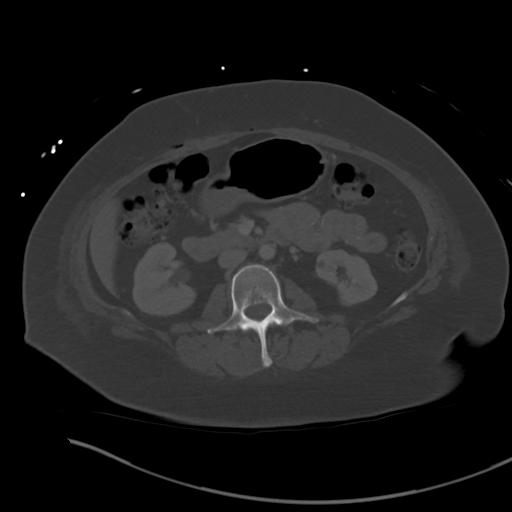
[im 71/100  soft-tissue]
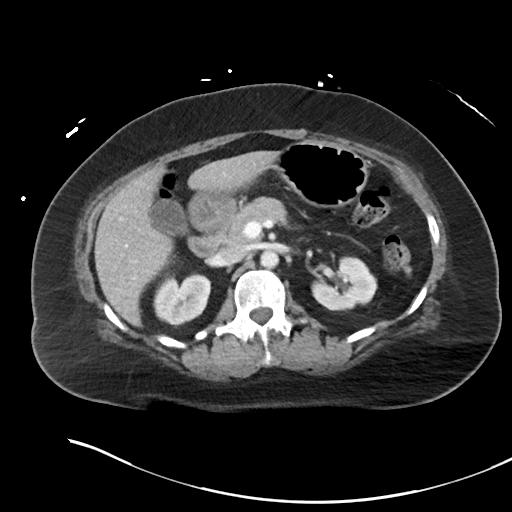
[im 80/100  soft-tissue]
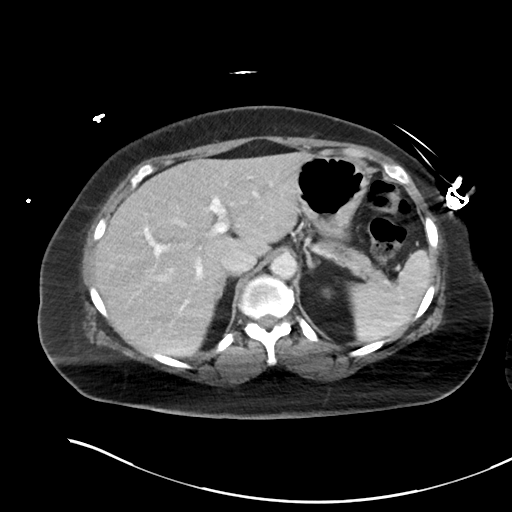
[im 87/100  soft-tissue]
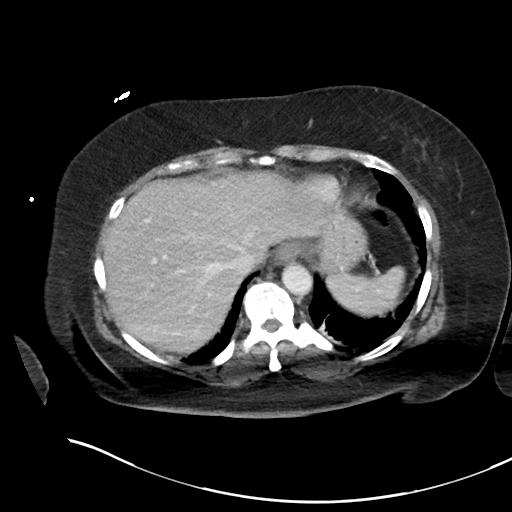
[im 93/100  soft-tissue]
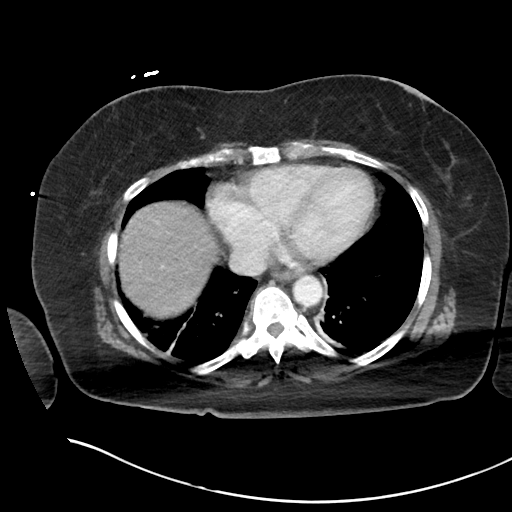

[Series 6: coronal soft tissue · coronal · 0.97mm/px · 3 of 101 slices shown]
[im 34/101  soft-tissue]
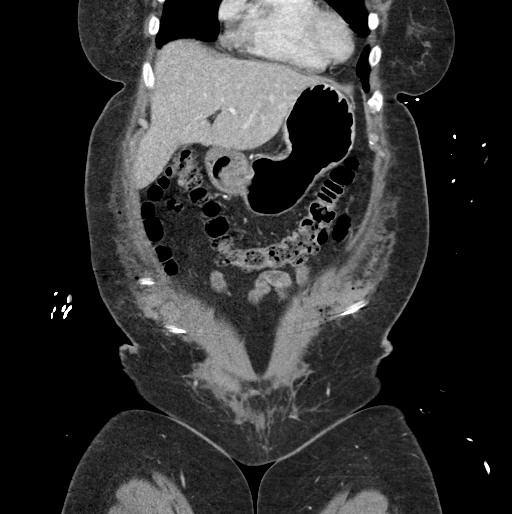
[im 45/101  soft-tissue]
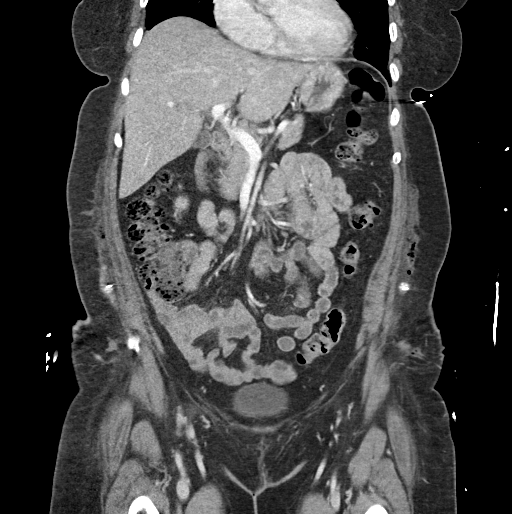
[im 56/101  soft-tissue]
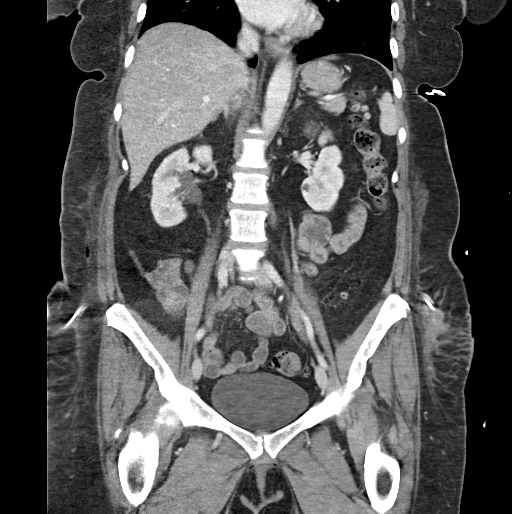

[16 of 46 positions shown; findings below may reference images not displayed]

RADIATION DOSE REDUCTION: This exam was performed according to the
departmental dose-optimization program which includes automated
exposure control, adjustment of the mA and/or kV according to
patient size and/or use of iterative reconstruction technique.

CONTRAST:  100mL OMNIPAQUE IOHEXOL 300 MG/ML  SOLN
FINDINGS: Lower chest: Bibasilar linear atelectasis versus scarring. No acute
abnormality.

Hepatobiliary: No focal liver abnormality. No gallstones,
gallbladder wall thickening, or pericholecystic fluid. No biliary
dilatation.

Pancreas: No focal lesion. Normal pancreatic contour. No surrounding
inflammatory changes. No main pancreatic ductal dilatation.

Spleen: Normal in size without focal abnormality.

Adrenals/Urinary Tract:

No adrenal nodule bilaterally.

Bilateral kidneys enhance symmetrically. Subcentimeter hypodensities
are too small to characterize.

No hydronephrosis. No hydroureter.

The urinary bladder is unremarkable.

Stomach/Bowel: Stomach is within normal limits. No evidence of bowel
wall thickening or dilatation. Scattered colonic diverticulosis.
Appendix appears normal. 80

Vascular/Lymphatic: No abdominal aorta or iliac aneurysm. Mild
atherosclerotic plaque of the aorta and its branches. No abdominal,
pelvic, or inguinal lymphadenopathy.

Reproductive: Status post hysterectomy. No adnexal masses.

Other: No intraperitoneal free fluid. No intraperitoneal free gas.
No organized fluid collection.

Musculoskeletal:

Diffuse subcutaneus soft tissue edema and emphysema likely
postsurgical in etiology with associated couple of surgical drains.
No peripherally enhancing organized fluid collection. Couple of deep
sub cutaneous densities along the surgical drain measuring 5.5 x
cm on the right and 5.5 x 1.6 cm on the left.

No suspicious lytic or blastic osseous lesions. No acute displaced
fracture. Multilevel degenerative changes of the spine.
IMPRESSION: 1. Diffuse subcutaneus soft tissue edema and emphysema likely
postsurgical in etiology with associated couple of surgical drains.
Couple of deep subcutaneous densities along the surgical drain
measuring 5.5 x 1.7 cm on the right and 5.5 x 1.6 cm on the left
likely representing hematomas or seromas. No peripherally enhancing
organized fluid collection to suggest abscess formation. Correlate
clinically for superimposed infection given emphysema.
2. No acute intra-abdominal or intrapelvic abnormality.

## 2024-05-07 ENCOUNTER — Other Ambulatory Visit: Payer: Self-pay

## 2024-05-07 MED ORDER — FLUZONE HIGH-DOSE 0.5 ML IM SUSY
0.5000 mL | PREFILLED_SYRINGE | Freq: Once | INTRAMUSCULAR | 0 refills | Status: AC
  Filled 2024-05-07: qty 0.5, 1d supply, fill #0

## 2024-08-07 ENCOUNTER — Encounter (HOSPITAL_BASED_OUTPATIENT_CLINIC_OR_DEPARTMENT_OTHER): Payer: Self-pay | Admitting: Cardiovascular Disease

## 2024-08-07 ENCOUNTER — Telehealth (HOSPITAL_BASED_OUTPATIENT_CLINIC_OR_DEPARTMENT_OTHER): Payer: Self-pay | Admitting: *Deleted

## 2024-08-07 NOTE — Telephone Encounter (Signed)
 Pt sent MY CHART message surgeon office sent preop request today.    [name] to P Cv Div Dwb Triage (supporting Annabella Scarce, MD)     08/07/24  4:11 PM She did that a little while ago, while I was sitting there. Let me know if you don't get it. Maritza Burnard FALCON, RN to Cv Div Preop Callback (Selected Message)     08/07/24  4:08 PM FYI.. This should be coming in Maritza Burnard FALCON, RN to KATIE FARAONE     08/07/24  4:07 PM The surgeons office will need to fax the request over to us  and our pre-op team will give recommendations for this.  Please have them fax to [phone].   Thanks,   Burnard  Last read by [name] at 4:10PM on 08/07/2024. [name] to P Cv Div Dwb Triage (supporting Annabella Scarce, MD)     08/07/24  4:00 PM [facility], Dr. Raguel surgery scheduler reached out on today to get your ok to have surgery on Feb. 4th for a muscle/tendon repair of the right elbow. They asked me to send a message just to make sure you see the request.   Thanks, Adrien

## 2024-08-08 ENCOUNTER — Telehealth (HOSPITAL_BASED_OUTPATIENT_CLINIC_OR_DEPARTMENT_OTHER): Payer: Self-pay | Admitting: *Deleted

## 2024-08-08 ENCOUNTER — Telehealth (HOSPITAL_BASED_OUTPATIENT_CLINIC_OR_DEPARTMENT_OTHER): Payer: Self-pay

## 2024-08-08 NOTE — Telephone Encounter (Signed)
 Pt has been scheduled tele preop appt 08/13/24. Med rec and consent are done.

## 2024-08-08 NOTE — Telephone Encounter (Signed)
" ° °  Name: Regina Daniels  DOB: 05/11/1956  MRN: [id number]  Primary Cardiologist: Annabella Scarce, MD  Last OV 03/2024.  Preoperative team, please contact this patient and set up a phone call appointment for further preoperative risk assessment. Please obtain consent and complete medication review. Thank you for your help.  No OAC/antiplatelets listed.  I also confirmed the patient resides in the state of [place] . As per [facility] Medical Board telemedicine laws, the patient must reside in the state in which the provider is licensed.   Raphael LOISE Bring, PA-C 08/08/2024, 9:50 AM [facility]    "

## 2024-08-08 NOTE — Telephone Encounter (Signed)
"  ° °  Pre-operative Risk Assessment    Patient Name: Regina Daniels  DOB: [birth year] MRN: [id number]   Date of last office visit: 03/30/2024 with Reche Finder, NP Date of next office visit: None  Request for Surgical Clearance    Procedure:  Left elbow debridement   Date of Surgery:  Clearance 08/22/24                                 Surgeon:  Dr. Bonner Hair Surgeon's Group or Practice Name:  [facility] Phone number:  [phone] Fax number:  [phone]   Type of Clearance Requested:   - Medical    Type of Anesthesia:  Choice   Additional requests/questions:  None  SignedPatrcia Iverson CROME   08/08/2024, 9:34 AM   "

## 2024-08-08 NOTE — Telephone Encounter (Signed)
 Pt has been scheduled tele preop appt 08/13/24. Med rec and consent are done.      Patient Consent for Virtual Visit        Regina Daniels has provided verbal consent on 08/08/2024 for a virtual visit (video or telephone).   CONSENT FOR VIRTUAL VISIT FOR:  Regina Daniels  By participating in this virtual visit I agree to the following:  I hereby voluntarily request, consent and authorize [facility] and its employed or contracted physicians, physician assistants, nurse practitioners or other licensed health care professionals (the Practitioner), to provide me with telemedicine health care services (the Services) as deemed necessary by the treating Practitioner. I acknowledge and consent to receive the Services by the Practitioner via telemedicine. I understand that the telemedicine visit will involve communicating with the Practitioner through live audiovisual communication technology and the disclosure of certain medical information by electronic transmission. I acknowledge that I have been given the opportunity to request an in-person assessment or other available alternative prior to the telemedicine visit and am voluntarily participating in the telemedicine visit.  I understand that I have the right to withhold or withdraw my consent to the use of telemedicine in the course of my care at any time, without affecting my right to future care or treatment, and that the Practitioner or I may terminate the telemedicine visit at any time. I understand that I have the right to inspect all information obtained and/or recorded in the course of the telemedicine visit and may receive copies of available information for a reasonable fee.  I understand that some of the potential risks of receiving the Services via telemedicine include:  Delay or interruption in medical evaluation due to technological equipment failure or disruption; Information transmitted may not be sufficient (e.g. poor resolution  of images) to allow for appropriate medical decision making by the Practitioner; and/or  In rare instances, security protocols could fail, causing a breach of personal health information.  Furthermore, I acknowledge that it is my responsibility to provide information about my medical history, conditions and care that is complete and accurate to the best of my ability. I acknowledge that Practitioner's advice, recommendations, and/or decision may be based on factors not within their control, such as incomplete or inaccurate data provided by me or distortions of diagnostic images or specimens that may result from electronic transmissions. I understand that the practice of medicine is not an exact science and that Practitioner makes no warranties or guarantees regarding treatment outcomes. I acknowledge that a copy of this consent can be made available to me via my patient portal [facility] MyChart), or I can request a printed copy by calling the office of [facility].    I understand that my insurance will be billed for this visit.   I have read or had this consent read to me. I understand the contents of this consent, which adequately explains the benefits and risks of the Services being provided via telemedicine.  I have been provided ample opportunity to ask questions regarding this consent and the Services and have had my questions answered to my satisfaction. I give my informed consent for the services to be provided through the use of telemedicine in my medical care

## 2024-08-13 ENCOUNTER — Ambulatory Visit

## 2024-08-15 NOTE — Progress Notes (Unsigned)
 "   Virtual Visit via Telephone Note   Because of JANAYLA MARIK co-morbid illnesses, she is at least at moderate risk for complications without adequate follow up.  This format is felt to be most appropriate for this patient at this time.  Due to technical limitations with video connection (technology), today's appointment will be conducted as an audio only telehealth visit, and Regina Daniels verbally agreed to proceed in this manner.   All issues noted in this document were discussed and addressed.  No physical exam could be performed with this format.  Evaluation Performed:  Preoperative cardiovascular risk assessment _____________   Date:  08/15/2024   Patient ID:  Regina Daniels, DOB 01/07/1956, MRN [id number] Patient Location:  Home Provider location:   Office  Primary Care Provider:  Henry Ingle, MD Primary Cardiologist:  Annabella Scarce, MD  Chief Complaint / Patient Profile   69 y.o. y/o female with a h/o HTN, hypothyroidism, AKI, syncope, anemia who is pending left elbow debridement and presents today for telephonic preoperative cardiovascular risk assessment.  History of Present Illness    Regina Daniels is a 69 y.o. female who presents via audio/video conferencing for a telehealth visit today.  Pt was last seen in cardiology clinic on 03/30/2024 by Reche Finder, NP-C.  At that time Regina Daniels was doing well .  The patient is now pending procedure as outlined above. Since her last visit, she remains stable from a cardiac standpoint.  Today she denies chest pain, shortness of breath, lower extremity edema, fatigue,  melena, hematuria, hemoptysis, diaphoresis, weakness, presyncope, syncope, orthopnea, and PND.   Past Medical History    Past Medical History:  Diagnosis Date   Allergy    Angio-edema    Arthritis    back   Chronic headaches    Sinus   Dysrhythmia    palpitations   Exertional dyspnea 06/20/2023   GERD (gastroesophageal reflux disease)    Groin  abscess    Hidradenitis suppurativa    reason she is on metformin and spironolactone    Hypertension    since age 89   Hypothyroidism    PONV (postoperative nausea and vomiting)    Primary hypertension 06/20/2023   Recurrent upper respiratory infection (URI)    Sinus arrhythmia 06/20/2023   Sleep difficulties    Thyroid  disease    hypothyroidism   Past Surgical History:  Procedure Laterality Date   ABDOMINAL HYSTERECTOMY  1993   BREAST SURGERY  1993 and 1994   Lt lumpectomy   INCISE AND DRAIN ABCESS     abscess left groin   INGUINAL HIDRADENITIS EXCISION  2012   bil   PANNICULECTOMY N/A 12/17/2021   Procedure: PANNICULECTOMY;  Surgeon: Lowery Estefana RAMAN, DO;  Location: [facility];  Service: Plastics;  Laterality: N/A;  4 hours   SHOULDER ACROMIOPLASTY Left 04/09/2015   Procedure: SHOULDER ACROMIOPLASTY;  Surgeon: Toribio Silos, MD;  Location: [facility];  Service: Orthopedics;  Laterality: Left;   SHOULDER ARTHROSCOPY WITH OPEN ROTATOR CUFF REPAIR Left 04/09/2015   Procedure: LEFT SHOULDER ARTHROSCOPY DEBRIDEMENT ,  with open acromioplasty and rotator cuff repair;  Surgeon: Toribio Silos, MD;  Location: [facility];  Service: Orthopedics;  Laterality: Left;   SINOSCOPY     TEMPOROMANDIBULAR JOINT SURGERY  2002   TUBAL LIGATION      Allergies  Allergies[1]  Home Medications    Prior to Admission medications  Medication Sig Start Date End Date Taking?  Authorizing Provider  acetaminophen  (TYLENOL ) 500 MG tablet Take 500-1,000 mg by mouth every 6 (six) hours as needed for mild pain or headache.    [provider]  acyclovir ointment (ZOVIRAX) 5 % Apply 1 application. topically daily as needed (AS DIRECTED- TO AFFECTED SITE). 11/02/19   [provider]  albuterol (VENTOLIN HFA) 108 (90 Base) MCG/ACT inhaler Inhale 2 puffs into the lungs every 4 (four) hours as needed for wheezing or shortness of breath.     [provider]  cetirizine (ZYRTEC) 10 MG tablet Take 10 mg by mouth at bedtime.    [provider]  clindamycin (CLEOCIN T) 1 % external solution Apply 1 application. topically daily as needed (AS DIRECTED- TO AFFECTED SITE). 10/03/20   [provider]  doxycycline  (ADOXA) 100 MG tablet Take 100 mg by mouth daily. Patient taking differently: Take 50 mg by mouth daily.    [provider]  estradiol (ESTRACE) 1 MG tablet  08/29/18   [provider]  fluticasone  (FLONASE ) 50 MCG/ACT nasal spray Place 1 spray into both nostrils daily. 07/01/17   [provider]  furosemide  (LASIX ) 20 MG tablet Take as needed for swelling. Please call if you are taking more than twice a week! Patient taking differently: as needed. Take as needed for swelling. Please call if you are taking more than twice a week! 09/22/23   Walker, Caitlin S, NP  hydrochlorothiazide (MICROZIDE) 12.5 MG capsule Take 12.5 mg by mouth daily. 01/26/22   [provider]  ipratropium (ATROVENT ) 0.06 % nasal spray Apply 1 to 2 sprays in each nostril twice a day as needed for runny nose. 12/10/20   Cari Arlean HERO, FNP  levocetirizine (XYZAL) 5 MG tablet Take 5 mg by mouth every evening. Patient taking differently: Take 5 mg by mouth as needed.    [provider]  metoprolol succinate (TOPROL-XL) 25 MG 24 hr tablet Take 25 mg by mouth daily.    [provider]  pantoprazole  (PROTONIX ) 40 MG tablet Take 40 mg by mouth daily before breakfast.    [provider]  rosuvastatin  (CRESTOR ) 10 MG tablet Take 10 mg by mouth at bedtime. 10/24/19   [provider]  spironolactone  (ALDACTONE ) 50 MG tablet Take 50 mg by mouth daily. 07/27/24   [provider]    Physical Exam    Vital Signs:  Regina Daniels does not have vital signs available for review today.  Given telephonic nature of communication, physical exam is limited. AAOx3. NAD. Normal affect.   Speech and respirations are unlabored.  Accessory Clinical Findings    None  Assessment & Plan    1.  Preoperative Cardiovascular Risk Assessment: Left elbow debridement    Date of Surgery:  Clearance 08/22/24                                  Surgeon:  Dr. Bonner Hair Surgeon's Group or Practice Name:  [facility] Phone number:  [phone] Fax number:  [phone]      Primary Cardiologist: Annabella Scarce, MD  Chart reviewed as part of pre-operative protocol coverage. Given past medical history and time since last visit, based on ACC/AHA guidelines, Regina Daniels would be at acceptable risk for the planned procedure without further cardiovascular testing.   Her RCRI is low risk, 0.9% risk of major cardiac event.  She is able to complete greater than 4 METS  of physical activity.  Patient was advised that if she develops new symptoms prior to surgery to contact our office to arrange a follow-up appointment.  She verbalized understanding.  I will route this recommendation to the requesting party via Epic fax function and remove from pre-op pool.       Time:   Today, I have spent 7 minutes with the patient with telehealth technology discussing medical history, symptoms, and management plan.  I spent 10 minutes reviewing patient's past cardiac history and cardiac medications.    Regina CHRISTELLA Beauvais, NP  08/15/2024, 9:29 AM     [1]  Allergies Allergen Reactions   Meperidine Hcl Hives   Other Other (See Comments)    Steroid injections-headaches   Prednisone Other (See Comments)    Headaches    Sudafed [Pseudoephedrine Hcl]     Pt stated, Makes me feel wired and keeps me up all night   Demerol Hives, Rash and Other (See Comments)    All-over body hives   Epinephrine  Palpitations   "

## 2024-08-16 ENCOUNTER — Ambulatory Visit: Attending: Cardiovascular Disease

## 2024-08-16 DIAGNOSIS — Z01810 Encounter for preprocedural cardiovascular examination: Secondary | ICD-10-CM | POA: Diagnosis not present
